# Patient Record
Sex: Male | Born: 1935 | Race: White | Hispanic: No | Marital: Married | Smoking: Former smoker
Health system: Southern US, Community
[De-identification: ages and names within clinical notes are randomized; demographics above are authoritative.]

## PROBLEM LIST (undated history)

## (undated) DIAGNOSIS — I499 Cardiac arrhythmia, unspecified: Secondary | ICD-10-CM

## (undated) DIAGNOSIS — R55 Syncope and collapse: Secondary | ICD-10-CM

## (undated) DIAGNOSIS — S8290XA Unspecified fracture of unspecified lower leg, initial encounter for closed fracture: Secondary | ICD-10-CM

## (undated) HISTORY — PX: OTHER SURGICAL HISTORY: SHX169

## (undated) HISTORY — DX: Unspecified fracture of unspecified lower leg, initial encounter for closed fracture: S82.90XA

## (undated) HISTORY — PX: MUSCLE REPAIR: SHX867

---

## 2002-07-13 ENCOUNTER — Inpatient Hospital Stay (HOSPITAL_COMMUNITY): Admission: RE | Admit: 2002-07-13 | Discharge: 2002-07-14 | Payer: Self-pay | Admitting: Neurosurgery

## 2002-07-13 ENCOUNTER — Encounter: Payer: Self-pay | Admitting: Neurosurgery

## 2004-01-26 ENCOUNTER — Other Ambulatory Visit: Payer: Self-pay

## 2004-01-28 ENCOUNTER — Other Ambulatory Visit: Payer: Self-pay

## 2007-03-16 ENCOUNTER — Ambulatory Visit: Payer: Self-pay

## 2007-03-18 ENCOUNTER — Ambulatory Visit: Payer: Self-pay

## 2007-04-05 ENCOUNTER — Ambulatory Visit: Payer: Self-pay | Admitting: Unknown Physician Specialty

## 2007-04-21 ENCOUNTER — Ambulatory Visit: Payer: Self-pay | Admitting: Specialist

## 2009-09-24 ENCOUNTER — Ambulatory Visit: Payer: Self-pay | Admitting: Ophthalmology

## 2009-10-07 ENCOUNTER — Ambulatory Visit: Payer: Self-pay | Admitting: Ophthalmology

## 2010-04-09 ENCOUNTER — Ambulatory Visit: Payer: Self-pay | Admitting: Endocrinology

## 2012-05-18 ENCOUNTER — Ambulatory Visit: Payer: Self-pay | Admitting: Cardiology

## 2012-05-24 ENCOUNTER — Ambulatory Visit: Payer: Self-pay | Admitting: General Surgery

## 2012-05-24 HISTORY — PX: HERNIA REPAIR: SHX51

## 2013-11-07 ENCOUNTER — Ambulatory Visit: Payer: Self-pay | Admitting: Ophthalmology

## 2013-11-07 DIAGNOSIS — I10 Essential (primary) hypertension: Secondary | ICD-10-CM

## 2013-11-13 ENCOUNTER — Ambulatory Visit: Payer: Self-pay | Admitting: Ophthalmology

## 2014-08-13 ENCOUNTER — Institutional Professional Consult (permissible substitution): Payer: Medicare Other | Admitting: Pulmonary Disease

## 2014-08-28 ENCOUNTER — Ambulatory Visit (INDEPENDENT_AMBULATORY_CARE_PROVIDER_SITE_OTHER): Payer: Medicare Other | Admitting: Pulmonary Disease

## 2014-08-28 ENCOUNTER — Encounter: Payer: Self-pay | Admitting: Pulmonary Disease

## 2014-08-28 VITALS — BP 124/70 | HR 80 | Ht 67.0 in | Wt 174.0 lb

## 2014-08-28 DIAGNOSIS — R1314 Dysphagia, pharyngoesophageal phase: Secondary | ICD-10-CM

## 2014-08-28 DIAGNOSIS — R059 Cough, unspecified: Secondary | ICD-10-CM

## 2014-08-28 DIAGNOSIS — J300 Vasomotor rhinitis: Secondary | ICD-10-CM

## 2014-08-28 DIAGNOSIS — R05 Cough: Secondary | ICD-10-CM

## 2014-08-28 MED ORDER — IPRATROPIUM BROMIDE 0.03 % NA SOLN: 2.0000 | Freq: Four times a day (QID) | NASAL | Status: DC

## 2014-08-28 NOTE — Progress Notes (Signed)
Subjective:    Patient ID: Alexander Webster, male    DOB: 07/13/1928, 80 y.o.   MRN: [id number]  HPI  Alexander Webster is here to see me for cough with mucus production.  He has experienced sinus drainage over the years and undergone an ENT evaluation which was normal.  He has seen Dr. Mayo AoFlemming in the past and has had PFTs.  He is unaware of any of diagnosis made there.  He previously smoked 1 pack a day for 25 or 30 years.    He notes that he will produce mucus throughout the day.  He notes that nearly continuously throughout the day he will produce signficant amounts.  This has been going on for 4-5 years.  It has ben about the same the whole time.  He has to cough up mucus at night frequently.  There are no clear triggers in his environment that make him cough more.  He says that it is worse early in the morning.  He walks regularly in the mornings and this makes him cough somewhat.  It is usually clear phlegm with out any color to it.  He says that he can feel mucus running down the back of his throat.    He notes trouble swallowing pills sometime.  Vitamins often get stuck and he has to drink water to make it pass.  He does not have problems swallowing food and drink.   He really doesn't get much shortness of breath very often.  He mentioned some mild dyspnea when climbing a hill to his PCP so he underwent a CXR, stress test and EKG which was normal. This was in 03/2014.  He does not think that this mild dyspnea has worsened. He regularly hikes 5-6 miles at a time with his brother.    No past medical history on file.   Family History  Problem Relation Age of Onset  . Heart disease Mother   . Cancer Mother     lymphoma  . Cancer Father     prostate     History   Social History  . Marital Status: Married    Spouse Name: N/A    Number of Children: N/A  . Years of Education: N/A   Occupational History  . Not on file.   Social History Main Topics  . Smoking status: Former Smoker -- 1.00  packs/day for 20 years    Types: Cigarettes, Pipe    Quit date: 08/28/1994  . Smokeless tobacco: Never Used     Comment: Smoked cigarettes, pipe.   . Alcohol Use: No  . Drug Use: Not on file  . Sexual Activity: Not on file   Other Topics Concern  . Not on file   Social History Narrative  . No narrative on file     Allergies  Allergen Reactions  . Percocet [Oxycodone-Acetaminophen]     itching     No outpatient prescriptions prior to visit.   No facility-administered medications prior to visit.      Review of Systems  Constitutional: Negative for fever and unexpected weight change.  HENT: Positive for congestion. Negative for dental problem, ear pain, nosebleeds, postnasal drip, rhinorrhea, sinus pressure, sneezing, sore throat and trouble swallowing.   Eyes: Negative for redness and itching.  Respiratory: Negative for cough, chest tightness, shortness of breath and wheezing.   Cardiovascular: Negative for palpitations and leg swelling.  Gastrointestinal: Negative for nausea and vomiting.  Genitourinary: Negative for dysuria.  Musculoskeletal: Negative for joint  swelling.  Skin: Negative for rash.  Neurological: Negative for headaches.  Hematological: Does not bruise/bleed easily.  Psychiatric/Behavioral: Negative for dysphoric mood. The patient is not nervous/anxious.        Objective:   Physical Exam Filed Vitals:   08/28/14 1548  BP: 124/70  Pulse: 80  Height: 5\' 7"  (1.702 m)  Weight: 174 lb (78.926 kg)  SpO2: 94%   RA  Gen: well appearing, no acute distress HEENT: NCAT, PERRL, EOMi, OP clear, neck supple without masses PULM: CTA B CV: RRR, no mgr, no JVD AB: BS+, soft, nontender, no hsm Ext: warm, no edema, no clubbing, no cyanosis Derm: no rash or skin breakdown Neuro: A&Ox4, CN II-XII intact, strength 5/5 in all 4 extremities      Assessment & Plan:   Cough He describes daily sputum production. However, in clinic he only coughed up some  saliva versus very thin, watery mucus. On pulmonary exam I do not hear clear evidence of lung disease and his oximetry is normal. Unfortunately, I am unable to see any of his recent chest imaging or old records of pulmonary function testing. At this point I do not see clear evidence of lung disease.  My working diagnosis at this point is cough due to vasomotor rhinitis.  Plan: -Obtain records of previous chest imaging, most recent was this year -Obtain records of previous pulmonary evaluation and pulmonary function testing -Obtain records from ear nose and throat physician -See below  Vasomotor rhinitis Vasomotor rhinitis (chronic non-allergic rhinitis): When inhaled corticosteroids or antihistamines are not helpful, ipratropium nasal spray (0.03%, 2 puffs each nare tid) is often effective as demonstrated in two trials of 285 and 253 patients (J Allergy Clin Immunol. 1610;96(01995;95(5 Pt 2):1123. nd J Allergy Clin Immunol. 4540;98(11995;95(5 Pt 2):1117).  -start ipratropium nasal spray (0.03%) 2 puffs bid-tid prn     Updated Medication List No outpatient encounter prescriptions on file as of 08/28/2014.

## 2014-08-28 NOTE — Patient Instructions (Signed)
We will arrange a barium swallow test for you Take the ipratroium nasal spray 4 times a day regularly for a week to see if that helps We will see you back in 2-4 weeks

## 2014-08-28 NOTE — Assessment & Plan Note (Signed)
Vasomotor rhinitis (chronic non-allergic rhinitis): When inhaled corticosteroids or antihistamines are not helpful, ipratropium nasal spray (0.03%, 2 puffs each nare tid) is often effective as demonstrated in two trials of 285 and 253 patients (J Allergy Clin Immunol. 1995;95(5 Pt 2):1123. nd J Allergy Clin Immunol. 1995;95(5 Pt 2):1117).  -start ipratropium nasal spray (0.03%) 2 puffs bid-tid prn  

## 2014-08-28 NOTE — Assessment & Plan Note (Signed)
He describes daily sputum production. However, in clinic he only coughed up some saliva versus very thin, watery mucus. On pulmonary exam I do not hear clear evidence of lung disease and his oximetry is normal. Unfortunately, I am unable to see any of his recent chest imaging or old records of pulmonary function testing. At this point I do not see clear evidence of lung disease.  My working diagnosis at this point is cough due to vasomotor rhinitis.  Plan: -Obtain records of previous chest imaging, most recent was this year -Obtain records of previous pulmonary evaluation and pulmonary function testing -Obtain records from ear nose and throat physician -See below

## 2014-09-04 ENCOUNTER — Ambulatory Visit: Payer: Self-pay | Admitting: Pulmonary Disease

## 2014-09-07 ENCOUNTER — Encounter: Payer: Self-pay | Admitting: Pulmonary Disease

## 2014-09-10 ENCOUNTER — Telehealth: Payer: Self-pay

## 2014-09-10 DIAGNOSIS — R059 Cough, unspecified: Secondary | ICD-10-CM

## 2014-09-10 DIAGNOSIS — R05 Cough: Secondary | ICD-10-CM

## 2014-09-10 NOTE — Telephone Encounter (Signed)
Pt aware of results and recs. Referral made.  Nothing further needed.

## 2014-09-10 NOTE — Telephone Encounter (Signed)
Message copied by Velvet BatheAULFIELD, Alaysia Lightle L on Mon Sep 10, 2014  5:58 PM ------      Message from: Max FickleMCQUAID, DOUGLAS B      Created: Fri Sep 07, 2014  5:10 PM       A,      Please let him know that his barium swallow showed a narrowing in his distal esophagus.  The radiologist didn't think that this was worrisome.            I'd like to refer him to Dr. Kandis FantasiaMatt Rein in [place] for evaluation of this.            Thanks      B ------

## 2014-09-21 ENCOUNTER — Encounter: Payer: Self-pay | Admitting: Pulmonary Disease

## 2014-12-20 ENCOUNTER — Ambulatory Visit: Payer: Self-pay | Admitting: Unknown Physician Specialty

## 2015-02-08 ENCOUNTER — Inpatient Hospital Stay: Payer: Self-pay | Admitting: General Practice

## 2015-02-08 HISTORY — PX: LEG SURGERY: SHX1003

## 2015-02-08 LAB — CBC
HCT: 45.2 % (ref 40.0–52.0)
HGB: 15.1 g/dL (ref 13.0–18.0)
MCH: 32.3 pg (ref 26.0–34.0)
MCHC: 33.4 g/dL (ref 32.0–36.0)
MCV: 97 fL (ref 80–100)
Platelet: 189 10*3/uL (ref 150–440)
RBC: 4.68 10*6/uL (ref 4.40–5.90)
RDW: 13.7 % (ref 11.5–14.5)
WBC: 6.9 10*3/uL (ref 3.8–10.6)

## 2015-02-08 LAB — COMPREHENSIVE METABOLIC PANEL
Albumin: 4.2 g/dL
Alkaline Phosphatase: 58 U/L
Anion Gap: 7 (ref 7–16)
BUN: 26 mg/dL — ABNORMAL HIGH
Bilirubin,Total: 0.8 mg/dL
CALCIUM: 9.4 mg/dL
Chloride: 109 mmol/L
Co2: 25 mmol/L
Creatinine: 1.2 mg/dL
EGFR (Non-African Amer.): 55 — ABNORMAL LOW
Glucose: 106 mg/dL — ABNORMAL HIGH
Potassium: 4.3 mmol/L
SGOT(AST): 28 U/L
SGPT (ALT): 21 U/L
Sodium: 141 mmol/L
Total Protein: 7 g/dL

## 2015-02-08 LAB — CK: CK, Total: 349 U/L

## 2015-02-09 LAB — URINALYSIS, COMPLETE
Bacteria: NONE SEEN
Bilirubin,UR: NEGATIVE
Blood: NEGATIVE
Glucose,UR: NEGATIVE mg/dL (ref 0–75)
LEUKOCYTE ESTERASE: NEGATIVE
Nitrite: NEGATIVE
Ph: 5 (ref 4.5–8.0)
Protein: NEGATIVE
RBC,UR: 1 /HPF (ref 0–5)
Specific Gravity: 1.028 (ref 1.003–1.030)
Squamous Epithelial: NONE SEEN
WBC UR: 1 /HPF (ref 0–5)

## 2015-02-11 ENCOUNTER — Encounter
Admit: 2015-02-11 | Disposition: A | Discharge status: Home / Self Care | Payer: Self-pay | Attending: Internal Medicine | Admitting: Internal Medicine

## 2015-02-15 ENCOUNTER — Encounter
Admit: 2015-02-15 | Disposition: A | Discharge status: Home / Self Care | Payer: Self-pay | Attending: Internal Medicine | Admitting: Internal Medicine

## 2015-03-09 NOTE — Op Note (Signed)
PATIENT NAME:  Alexander Webster, Alexander Webster MR#:  [id number] DATE OF BIRTH:  13-Nov-1928  DATE OF PROCEDURE:  11/13/2013  PREOPERATIVE DIAGNOSIS: Cataract, left eye.   POSTOPERATIVE DIAGNOSIS: Cataract, left eye.   PROCEDURE PERFORMED: Extracapsular cataract extraction using phacoemulsification with placement Alcon [id number], 23.0-diopter posterior chamber lens, serial number [id number].   SURGEON: Alexander Webster, M.D.   ANESTHESIA: 4% lidocaine and 0.75% Marcaine,  a 50:50 mixture with 10 units/mL of HyoMax added, given as a peribulbar.   ANESTHESIOLOGIST: Dr. Darleene CleaverVan Staveren.   COMPLICATIONS: None.   ESTIMATED BLOOD LOSS: Less than 1 mL.   DESCRIPTION OF PROCEDURE:  The patient was brought to the operating room and given a peribulbar block.  The patient was then prepped and draped in the usual fashion.  The vertical rectus muscles were imbricated using 5-0 silk sutures.  These sutures were then clamped to the sterile drapes as bridle sutures.  A limbal peritomy was performed extending two clock hours and hemostasis was obtained with cautery.  A partial thickness scleral groove was made at the surgical limbus and dissected anteriorly in a lamellar dissection using an Alcon crescent knife.  The anterior chamber was entered supero-temporally with a Superblade and through the lamellar dissection with a 2.6 mm keratome.  DisCoVisc was used to replace the aqueous and a continuous tear capsulorrhexis was carried out.  Hydrodissection and hydrodelineation were carried out with balanced salt and a 27 gauge canula.  The nucleus was rotated to confirm the effectiveness of the hydrodissection.  Phacoemulsification was carried out using a divide-and-conquer technique.  Total ultrasound time was 1 minute and 17.3 seconds with an average power of 23.6 percent. CDE of 32.20.  No sutures placed.    Irrigation/aspiration was used to remove the residual cortex.  DisCoVisc was used to inflate the capsule and the internal  incision was enlarged to 3 mm with the crescent knife.  The intraocular lens was folded and inserted into the capsular bag using an AcrySert delivery system was used.  Irrigation/aspiration was used to remove the residual DisCoVisc.  Miostat was injected into the anterior chamber through the paracentesis track to inflate the anterior chamber and induce miosis.  A tenth of a milliliter of cefuroxime containing 1 mg of drug was injected via the paracentesis tract.  The wound was checked for leaks and none were found. The conjunctiva was closed with cautery and the bridle sutures were removed.  Two drops of 0.3% Vigamox were placed on the eye.  An eye shield was placed on the eye.  The patient was discharged to the recovery room in good condition.   ____________________________ Alexander PeppersSteven A. Treniya Lobb, MD sad:np D: 11/13/2013 13:37:39 ET T: 11/13/2013 15:08:36 ET JOB#: [id number]  cc: Viviann SpareSteven A. Rolando Whitby, MD, <Dictator> Erline LevineSTEVEN A Aleeyah Bensen MD ELECTRONICALLY SIGNED 11/20/2013 11:55

## 2015-03-10 NOTE — Op Note (Signed)
PATIENT NAME:  [name], [name] MR#:  [id number] DATE OF BIRTH:  [birth year]  DATE OF PROCEDURE:  05/24/2012  PREOPERATIVE DIAGNOSIS: Bilateral inguinal hernia.   POSTOPERATIVE DIAGNOSES:  1. Left inguinal hernia, indirect of the sliding variety along with a small direct hernia. 2. Right direct inguinal hernia.   OPERATION: Bilateral inguinal hernia repairs.   SURGEON: Kathreen CosierS. G. Kayven Aldaco, M.D.   ANESTHESIA: General.   COMPLICATIONS: None.  ESTIMATED BLOOD LOSS: 25 mL.  DRAINS: None.   DESCRIPTION OF PROCEDURE: The patient was put to sleep with a LMA and the groin areas were prepped and draped out as a sterile field. Skin incisions were mapped on both sides overlying the medial two-thirds of the inguinal canal area. 15 mL of 0.5% Marcaine was used to reinforce pain control in both groins. A total of 30 mL was used. The left side was operated on first since this was his symptomatic side. A skin incision was made and deepened through the layers down to the external oblique. Bleeding controlled with cautery and ligatures of 3-0 Vicryl. The external oblique was opened along the line of its fibers and local anesthetic was instilled into the area of the pubic tubercle and also near the internal ring area. The cord was dissected free from the posterior wall and noted to contain a small direct hernia in the medial aspect. The fascia covering the cord was opened to reveal the presence of a lipoma of the cord attached to which was a peritoneal sac, but it turns out that this was a sliding hernia with the medial wall of the sac being that of the sigmoid colon. After ensuring that there were no other contents within the hernia, the peritoneal opening was closed and the entire hernial protrusion was pushed back in through the internal ring which was fairly wide. This was then narrowed down by placement of 2-0 PDS stitch laterally to close the internal ring. Following this, the fascia overlying the cord was closed with 2-0  Vicryl. The direct hernia was pushed back and held in place with plicating sutures of 2-0 Vicryl. The posterior wall was satisfactorily exposed. A Parietex ProGrip mesh was then placed around the cord and laid down. Excess of this along the inferior edge was trimmed. The medial end was tacked to the pubic tubercle with a single 2-0 PDS stitch. The lateral ends were then tucked underneath the external oblique and satisfactory reinforcement of the posterior wall was obtained. After ensuring hemostasis, the external oblique was closed over with a running 2-0 PDS stitch. The subcutaneous tissue was closed with 3-0 Vicryl and then skin closed with subcuticular 4-0 Vicryl. A similar procedure was performed on the right side now after instillation of local anesthetic. A skin incision was made and deepened through the external oblique which was then opened. The cord here was much thinner when compared to the one on the left side and exploration here did not reveal the presence of a hernia, but a small lipoma of the cord was encountered. This was excised out and ligated with 2-0 Vicryl. The posterior wall was exposed. A direct hernia was noted in the medial aspect which was freed and pushed back through what appeared to be a fingerbreadth size opening in the posterior wall of the inguinal canal. This was then closed with two stitches of 2-0 PDS. Reinforcement was again obtained with a ProGrip mesh placed over the posterior wall and around the internal ring and snug down to the posterior wall. Excess  trimmed along the inferior edge and the lateral aspect which was tucked underneath the external oblique. Again, the 2-0 PDS stitch was used to tack it to the pubic tubercle. After ensuring hemostasis, closure was obtained similar to that on the left side. Steri-Strips dressings were placed. The patient subsequently was extubated and returned to the recovery room in stable condition.   ____________________________ S.Wynona Luna,  MD sgs:ap D: 05/24/2012 13:14:29 ET T: 05/24/2012 13:30:15 ET JOB#: [id number]  cc: S.G. Evette Cristal, MD, <Dictator>  [facility] Wynona Luna MD ELECTRONICALLY SIGNED 05/25/2012 6:23

## 2015-03-11 LAB — SURGICAL PATHOLOGY

## 2015-03-17 NOTE — Op Note (Signed)
PATIENT NAME:  [name], [name] MR#:  [id number] DATE OF BIRTH:  January 22, 1928  DATE OF PROCEDURE:  02/08/2015  PREOPERATIVE DIAGNOSIS: Open right bimalleolar ankle fracture.   POSTOPERATIVE DIAGNOSIS: Open right bimalleolar ankle fracture.  PROCEDURE PERFORMED: Open reduction and internal fixation of right bimalleolar ankle fracture.   SURGEON: Illene LabradorJames P. Angie FavaHooten Jr., MD  ANESTHESIA: General.   ESTIMATED BLOOD LOSS: 25 mL.   FLUIDS REPLACED: Crystalloid 700 mL.   TOURNIQUET TIME: 90 minutes.   DRAINS: None.   IMPLANTS UTILIZED: Synthes 7 hole one-third tubular plate, five 3.5 mm cortical screws, one 4.0 mm fully threaded cancellous screw, and two 4.0 mm cannulated partially threaded cancellus screws.   INDICATIONS FOR SURGERY: The patient is an 81 year old male who was cutting down a tree when the tree kicked back and struck him along the left side causing him to fall to the right. He sustained a right bimalleolar ankle fracture with a horizontal laceration to the lateral aspect of the ankle. After discussion of the risks and benefits of surgical intervention, the patient expressed understanding of the risks, benefits, and agreed with plans for surgical intervention.   PROCEDURE IN DETAIL: The patient was brought to the operating room and, after adequate general anesthesia was achieved, a tourniquet was placed on the patient's right thigh. The patient's right ankle and leg were cleaned and prepped with Betadine and draped in the usual sterile fashion. A "timeout" was performed as per usual protocol. The right lower extremity was exsanguinated using an Esmarch, and the tourniquet was inflated to 300 mmHg. The lateral horizontal laceration was irrigated with copious amounts of normal saline with antibiotic solution. Skin edges were sharply debrided. The fracture site was identified using FluoroScan. A lateral longitudinal incision was made roughly creating a T-type intersection with the laceration.  Dissection was carried down to the fracture site. The site was cleared of hematoma and soft tissue and provisional reduction was performed. A 7 hole one-third tubular plate was contoured so as to be placed along the lateral aspect of the distal fibula. The plate was then secured using a total of five 3.5 mm cortical screws and one 4.0 mm fully threaded cancellous screw. Good position of the hardware was noted with anatomic reduction appreciated, as visualized on FluoroScan. The wound was again irrigated with copious amounts of normal saline with antibiotic solution. The skin edges were approximated in layers using first #0 Monocryl followed by 2-0 Monocryl. Next, attention was directed to the medial malleolus. The tip of the medial malleolus was identified and a transverse incision was made distal to the tip of the medial malleolus. Two 1.25 mm distally threaded guidewires were inserted in a parallel fashion into the medial malleolus and into the metaphyseal region. This allowed for excellent reduction restoration of the ankle mortise. Two 4.0 mm partially threaded a cannulated cancellous screws then advanced over the guidewires with good compression noted at the fracture site. Guidewires were removed. The wound was irrigated with copious amounts of normal saline with antibiotic solution. The skin edges of both the medial and lateral incisions were then reapproximated using skin staples. A sterile dressing was applied after deflating tourniquet at 90 minutes. A posterior splint was applied.   The patient tolerated the procedure well. He was transported to the recovery room in stable condition.    ____________________________ Illene LabradorJames P. Angie FavaHooten Jr., MD jph:bm D: 02/08/2015 21:20:39 ET T: 02/09/2015 05:06:54 ET JOB#: [id number]  cc: Illene LabradorJames P. Angie FavaHooten Jr., MD, <Dictator> JAMES P Angie FavaHOOTEN JR  MD ELECTRONICALLY SIGNED 02/23/2015 10:01

## 2015-03-17 NOTE — Discharge Summary (Signed)
PATIENT NAME:  Alexander Webster, Alexander Webster MR#:  [id number] DATE OF BIRTH:  1928/05/15  DATE OF ADMISSION:  02/08/2015 DATE OF DISCHARGE:  02/10/2015  ADMITTING DIAGNOSIS: Open right bimalleolar ankle fracture.   DISCHARGE DIAGNOSIS: Open right bimalleolar ankle fracture.   PROCEDURE PERFORMED: Open reduction internal fixation of right bimalleolar ankle fracture.   SURGEON: Francesco SorJames Hooten, M.D.   ANESTHESIA: General.   ESTIMATED BLOOD LOSS: 25 mL.  CRYSTALLOIDS:  700 mL.  TOURNIQUET TIME: 90 minutes.   DRAINS: None.   IMPLANTS UTILIZED:  Synthes 7-hole 1/3 tubular plate, five 3.5 mm cortical screws, one 4.0 mm fully threaded cancellous screw and two 4.0 mm cannulated partially threaded cancellous screws.   INDICATIONS FOR SURGERY:  The patient is an 81 year old male who was cutting down a tree.  The tree kicked back and struck him along the left side causing him to fall to the right. He sustained a right bimalleolar ankle fracture with a horizontal laceration to the lateral aspect of the ankle. After discussion of the risks and benefits of surgical intervention, the patient expressed understanding of the risks and benefits of, and agreed with plans, for surgical intervention.   PHYSICAL EXAMINATION:  HEENT: Pupils equal, round, and reactive to light. Hearing intact to voice. Moist oral mucosa.  OROPHARYNX: Clear. NECK: Supple.  RESPIRATIONS: Clear breath sounds, no use of accessory muscle usage.  CARDIOVASCULAR: Regular rate and rhythm. No murmur. No lower extremity edema.  ABDOMEN: Denies tenderness, soft, normal bowel sounds.  EXTREMITIES: Tender to palpation on the medial and lateral malleoli, transverse laceration to the lateral aspect of the ankle, moderate swelling of the medial ankle. Abrasions to the left lateral thigh and left forearm.  NEUROLOGIC:  Alert and oriented to person, place, and time.   HOSPITAL COURSE: The patient was admitted to the hospital on 02/08/2015. He had surgery  that same day and was brought to the orthopedic floor from the PACU in stable condition. On postoperative day 1, the patient had excellent progress with physical therapy. Pain was well controlled. Vital signs were stable. On postoperative day 2, the patient was stable and ready for discharge to home with home health physical therapy.  The patient had made significant progress in physical therapy over the first 2 days. He was stable and ready for discharge to home with home health physical therapy.   CONDITION AT DISCHARGE: Stable.   DISCHARGE INSTRUCTIONS: Do not put weight on the affected extremity. Elevate the affected extremity, heels off the bed. Use incentive spirometry every hour while awake, encourage cough and deep breathing. The patient may resume a regular diet as tolerated. Apply Polar Care unit maintaining 10 to 40-50 degrees.  Do not get the dressing or bandage wet or dirty.  Call [facility] if the dressing gets water under it.  Leave the dressing on. Call [facility] if any of the following occur: Bright red bleeding from the incisional wound, fever above 101.5 degrees, redness, swelling, or drainage at the incision site. Call [facility] if you experience any increased leg pain, numbness or weakness in your legs, or bowel or bladder symptoms. Call [facility] for a followup appointment on 3/28 or 3/292016.   DISCHARGE MEDICATIONS: Please see discharge medications on discharge instructions.    ____________________________ T. Cranston Neighborhris Tiffney Haughton, PA-C tcg:sp D: 02/09/2015 22:57:00 ET T: 02/10/2015 09:37:51 ET JOB#: [id number]  cc: T. Cranston Neighborhris Tysean Vand[place]li[place], PA-C, <Dictator> Evon SlackHOMAS C Falisa Lamora GeorgiaPA ELECTRONICALLY SIGNED 02/18/2015 15:41

## 2015-03-17 NOTE — H&P (Signed)
Subjective/Chief Complaint right ankle pain   History of Present Illness 81 year old male was cutting down a tree when it bounced back and struck him on the left leg, causing him to fall to the right. He had severe right ankle pain and was unable to bear weight on the right lower extremity. He was noted to have a laceration to the lateral right ankle. No loss of consciousness.   Past Med/Surgical Hx:  Osteoarthritis:   Hyperlipidemia:   Hypertension:   Right and Left Bundle Branch Block:   Hepatitis A:   Syncope:   Silent Migraines:   Knee Arthroscopy:   Left hand 1st digit reattachment:   Herniated Disc:   Cataract Extraction: right  Appendectomy:   Prostate Surgery:   Right leg ORIF:   ALLERGIES:  Vicodin: Other  Bee Stings: Rash, Resp. Distress   Medications None   Family and Social History:  Family History brain tumor - mother  prostate cancer - father   Social History negative Illicit drugs, wife recently died   + Tobacco Prior (greater than 1 year)   Place of Living Home   Review of Systems:  Fever/Chills No   Cough No   Sputum No   Abdominal Pain No   Diarrhea No   Constipation No   Nausea/Vomiting No   SOB/DOE No   Chest Pain No   Physical Exam:  GEN well developed, well nourished, no acute distress   HEENT PERRL, hearing intact to voice, moist oral mucosa, Oropharynx clear   NECK supple   RESP clear BS  no use of accessory muscles   CARD regular rate  no murmur  No LE edema   ABD denies tenderness  soft  normal BS   EXTR Tender to medial & lateral right malleoli, transverse laceration to lateral aspect of ankle. Moderate swelling to medial ankle.  Abrasions to lateral left thigh and left forearm   NEURO motor/sensory function intact   PSYCH alert, A+O to time, place, person   Lab Results: Hepatic:  25-Mar-16 13:57   Bilirubin, Total 0.8 (0.3-1.2 NOTE: New Reference Range  01/22/15)  Alkaline Phosphatase 58 (38-126 NOTE:  New Reference Range  01/22/15)  SGPT (ALT) 21 (17-63 NOTE: New Reference Range  01/22/15)  SGOT (AST) 28 (15-41 NOTE: New Reference Range  01/22/15)  Total Protein, Serum 7.0 (6.5-8.1 NOTE: New Reference Range  01/22/15)  Albumin, Serum 4.2 (3.5-5.0 NOTE: New reference range  01/22/15)  Routine Chem:  25-Mar-16 13:57   Glucose, Serum  106 (65-99 NOTE: New Reference Range  01/22/15)  BUN  26 (6-20 NOTE: New Reference Range  01/22/15)  Sodium, Serum 141 (135-145 NOTE: New Reference Range  01/22/15)  Potassium, Serum 4.3 (3.5-5.1 NOTE: New Reference Range  01/22/15)  Chloride, Serum 109 (101-111 NOTE: New Reference Range  01/22/15)  CO2, Serum 25 (22-32 NOTE: New Reference Range  01/22/15)  Calcium (Total), Serum 9.4 (8.9-10.3 NOTE: New Reference Range  01/22/15)  eGFR (African American) >60  eGFR (Non-African American)  55 (eGFR values <43m/min/1.73 m2 may be an indication of chronic kidney disease (CKD). Calculated eGFR is useful in patients with stable renal function. The eGFR calculation will not be reliable in acutely ill patients when serum creatinine is changing rapidly. It is not useful in patients on dialysis. The eGFR calculation may not be applicable to patients at the low and high extremes of body sizes, pregnant women, and vegetarians.)  Anion Gap 7  Cardiac:  25-Mar-16 13:57  CK, Total 349 [phone] NOTE: New Reference Range  01/22/15)  Routine Hem:  25-Mar-16 13:57   WBC (CBC) 6.9  RBC (CBC) 4.68  Hemoglobin (CBC) 15.1  Hematocrit (CBC) 45.2  Platelet Count (CBC) 189 (Result(s) reported on 08 Feb 2015 at 02:25PM.)  MCV 97  MCH 32.3  MCHC 33.4  RDW 13.7    Assessment/Admission Diagnosis Right bimalleolar ankle fracture with lateral laceration   Plan Recommend ORIF and debridement  The risks and benefits of surgical intervention were discussed in detail with the patient. The patient expressed understanding of the risks and benefits and  agreed with plans for surgery.  Surgical site signed as per "right site surgery" protocol.   Electronic Signatures: Dereck Leep (MD)  (Signed 25-Mar-16 17:15)  Authored: CHIEF COMPLAINT and HISTORY, PAST MEDICAL/SURGIAL HISTORY, ALLERGIES, OTHER MEDICATIONS, FAMILY AND SOCIAL HISTORY, REVIEW OF SYSTEMS, PHYSICAL EXAM, LABS, ASSESSMENT AND PLAN   Last Updated: 25-Mar-16 17:15 by Dereck Leep (MD)

## 2015-06-10 ENCOUNTER — Encounter: Payer: Self-pay | Admitting: *Deleted

## 2015-06-18 ENCOUNTER — Ambulatory Visit (INDEPENDENT_AMBULATORY_CARE_PROVIDER_SITE_OTHER): Payer: Medicare Other | Admitting: General Surgery

## 2015-06-18 ENCOUNTER — Encounter: Payer: Self-pay | Admitting: General Surgery

## 2015-06-18 VITALS — BP 140/80 | HR 80 | Resp 14 | Ht 66.0 in | Wt 160.0 lb

## 2015-06-18 DIAGNOSIS — R1030 Lower abdominal pain, unspecified: Secondary | ICD-10-CM | POA: Diagnosis not present

## 2015-06-18 DIAGNOSIS — R1031 Right lower quadrant pain: Secondary | ICD-10-CM

## 2015-06-18 NOTE — Patient Instructions (Addendum)
The patient is aware to call back for any questions or concerns.  Patient is scheduled for a CT of the abdomen/pelvis with contrast at [facility] on 07/01/15 at 8:00 am. He will arrive at registration at 7:45 am. The patient will pick up a prep kit today. He is to have liquids only 4 hours prior to the exam. Patient is aware of date, time, and instructions.

## 2015-06-18 NOTE — Progress Notes (Signed)
Patient ID: Alexander Webster, male   DOB: [birth year], 81 y.o.   MRN: [id number]  Chief Complaint  Patient presents with  . Hernia    HPI Alexander Webster is a 81 y.o. male.  Here today for evaluation of possible right inguinal hernia. He states he has had some pain in the right groin area on and off for 2-3 months as well as testicular pain. He said that Dr Ronnald Collum told him it was a hernia.  HPI  Past Medical History  Diagnosis Date  . Leg fracture     Past Surgical History  Procedure Laterality Date  . Muscle repair Right     calf  . Ruptured disc      ruptured disc repair  . Hernia repair Bilateral 05-24-12    Dr Jamal Collin  . Leg surgery Right 02/08/2015    Family History  Problem Relation Age of Onset  . Heart disease Mother   . Cancer Mother     lymphoma  . Cancer Father     prostate    Social History History  Substance Use Topics  . Smoking status: Former Smoker -- 1.00 packs/day for 20 years    Types: Cigarettes, Pipe    Quit date: 08/28/1994  . Smokeless tobacco: Never Used     Comment: Smoked cigarettes, pipe.   . Alcohol Use: No    Allergies  Allergen Reactions  . Percocet [Oxycodone-Acetaminophen]     Itching/pt states he can take this now  . Vicodin [Hydrocodone-Acetaminophen] Itching    Current Outpatient Prescriptions  Medication Sig Dispense Refill  . aspirin 325 MG EC tablet Take 325 mg by mouth daily.    . Calcium Carbonate-Vit D-Min (CALCIUM 1200 PO) Take by mouth daily.    . Cholecalciferol (VITAMIN D3) 2000 UNITS TABS Take by mouth daily.    . Cyanocobalamin (VITAMIN B-12 CR) 1000 MCG TBCR Take by mouth daily.    . Magnesium 250 MG TABS Take by mouth daily.    . NON FORMULARY Black stallion    . NON FORMULARY gsh-3 cell defense    . NON FORMULARY True curcumin    . NON FORMULARY resetigen d    . Nutritional Supplements (OSTEO ADVANCE PO) Take by mouth daily.    . Omega-3 Fatty Acids (FISH OIL) 1200 MG CAPS Take by mouth daily.     No current  facility-administered medications for this visit.    Review of Systems Review of Systems  Constitutional: Negative.   Respiratory: Positive for cough.   Cardiovascular: Negative.     Blood pressure 140/80, pulse 80, resp. rate 14, height _0  (1.676 m), weight 160 lb (72.576 kg).  Physical Exam Physical Exam  Constitutional: He is oriented to person, place, and time. He appears well-developed and well-nourished.  HENT:  Mouth/Throat: Oropharynx is clear and moist.  Eyes: Conjunctivae are normal. No scleral icterus.  Neck: Neck supple.  Cardiovascular: Normal rate, regular rhythm and normal heart sounds.   Pulmonary/Chest: Effort normal and breath sounds normal.  Abdominal: Soft. Normal appearance and bowel sounds are normal. There is no hepatomegaly. There is no tenderness. No hernia.  Lymphadenopathy:    He has no cervical adenopathy.  Neurological: He is alert and oriented to person, place, and time.  Skin: Skin is warm and dry.  Psychiatric: His behavior is normal.  Careful exam of right groin shows no apparent hernia.   Data Reviewed Office notes reviewed.   Assessment    Right groin and right  lower abdominal pain. No apparent findings on exam.      Plan    Recommended CT scan of abdomen/pelvis. Follow up based on the findings. Pt is agreeable.    Patient is scheduled for a CT of the abdomen/pelvis with contrast at [facility] on 07/01/15 at 8:00 am. He will arrive at registration at 7:45 am. The patient will pick up a prep kit today. He is to have liquids only 4 hours prior to the exam. Patient is aware of date, time, and instructions.  PCP:  Daphene Jaeger G 06/18/2015, 8:23 PM

## 2015-07-01 ENCOUNTER — Ambulatory Visit
Admission: RE | Admit: 2015-07-01 | Discharge: 2015-07-01 | Disposition: A | Discharge status: Home / Self Care | Payer: Medicare Other | Source: Ambulatory Visit | Attending: General Surgery | Admitting: General Surgery

## 2015-07-01 DIAGNOSIS — N281 Cyst of kidney, acquired: Secondary | ICD-10-CM | POA: Diagnosis not present

## 2015-07-01 DIAGNOSIS — R1030 Lower abdominal pain, unspecified: Secondary | ICD-10-CM | POA: Diagnosis present

## 2015-07-01 DIAGNOSIS — M47816 Spondylosis without myelopathy or radiculopathy, lumbar region: Secondary | ICD-10-CM | POA: Insufficient documentation

## 2015-07-01 DIAGNOSIS — R1031 Right lower quadrant pain: Secondary | ICD-10-CM

## 2015-07-01 DIAGNOSIS — I2510 Atherosclerotic heart disease of native coronary artery without angina pectoris: Secondary | ICD-10-CM | POA: Insufficient documentation

## 2015-07-01 DIAGNOSIS — K4020 Bilateral inguinal hernia, without obstruction or gangrene, not specified as recurrent: Secondary | ICD-10-CM | POA: Insufficient documentation

## 2015-07-01 MED ORDER — IOHEXOL 300 MG/ML  SOLN
100.0000 mL | Freq: Once | INTRAMUSCULAR | Status: AC | PRN
  Administered 2015-07-01: 100 mL via INTRAVENOUS

## 2015-07-04 ENCOUNTER — Telehealth: Payer: Self-pay | Admitting: *Deleted

## 2015-07-04 NOTE — Telephone Encounter (Signed)
Patient has been scheduled for an appointment next Tuesday, 07-09-15.

## 2015-07-04 NOTE — Telephone Encounter (Signed)
-----   Message from Kieth Brightly, MD sent at 07/04/2015  7:14 AM EDT ----- Please make an appt for pt to discuss CT findings

## 2015-07-04 NOTE — Telephone Encounter (Signed)
Message left on home and cell numbers for patient to call the office.  

## 2015-07-09 ENCOUNTER — Encounter: Payer: Self-pay | Admitting: General Surgery

## 2015-07-09 ENCOUNTER — Ambulatory Visit (INDEPENDENT_AMBULATORY_CARE_PROVIDER_SITE_OTHER): Payer: Medicare Other | Admitting: General Surgery

## 2015-07-09 VITALS — BP 112/70 | HR 78 | Resp 18 | Ht 66.0 in | Wt 166.0 lb

## 2015-07-09 DIAGNOSIS — R1030 Lower abdominal pain, unspecified: Secondary | ICD-10-CM | POA: Diagnosis not present

## 2015-07-09 DIAGNOSIS — R1031 Right lower quadrant pain: Secondary | ICD-10-CM

## 2015-07-09 NOTE — Progress Notes (Signed)
Patient ID: Alexander Webster, male   DOB: 10-Dec-1927, 80 y.o.   MRN: [id number]  Chief Complaint  Patient presents with  . Follow-up    CT Scan on 07/01/2015    HPI Alexander Webster is a 81 y.o. male.  Here today following up from a CT scan one 07-01-15. Denies any pain in the groin area at this time. States he was able to work out in the garden yesterday with no pain.    HPI  Past Medical History  Diagnosis Date  . Leg fracture     Past Surgical History  Procedure Laterality Date  . Muscle repair Right     calf  . Ruptured disc      ruptured disc repair  . Hernia repair Bilateral 05-24-12    Dr Evette Cristal  . Leg surgery Right 02/08/2015    Family History  Problem Relation Age of Onset  . Heart disease Mother   . Cancer Mother     lymphoma  . Cancer Father     prostate    Social History Social History  Substance Use Topics  . Smoking status: Former Smoker -- 1.00 packs/day for 20 years    Types: Cigarettes, Pipe    Quit date: 08/28/1994  . Smokeless tobacco: Never Used     Comment: Smoked cigarettes, pipe.   . Alcohol Use: No    Allergies  Allergen Reactions  . Percocet [Oxycodone-Acetaminophen]     Itching/pt states he can take this now  . Vicodin [Hydrocodone-Acetaminophen] Itching    Current Outpatient Prescriptions  Medication Sig Dispense Refill  . aspirin 325 MG EC tablet Take 325 mg by mouth daily.    . Calcium Carbonate-Vit D-Min (CALCIUM 1200 PO) Take by mouth daily.    . Cholecalciferol (VITAMIN D3) 2000 UNITS TABS Take by mouth daily.    . Cyanocobalamin (VITAMIN B-12 CR) 1000 MCG TBCR Take by mouth daily.    . Magnesium 250 MG TABS Take by mouth daily.    . NON FORMULARY Black stallion    . NON FORMULARY gsh-3 cell defense    . NON FORMULARY True curcumin    . NON FORMULARY resetigen d    . Nutritional Supplements (OSTEO ADVANCE PO) Take by mouth daily.    . Omega-3 Fatty Acids (FISH OIL) 1200 MG CAPS Take by mouth daily.    . simvastatin (ZOCOR) 10 MG  tablet Take 10 mg by mouth every evening.  0   No current facility-administered medications for this visit.    Review of Systems Review of Systems  Constitutional: Negative.   Respiratory: Negative.   Cardiovascular: Negative.     Blood pressure 112/70, pulse 78, resp. rate 18, height  (1.676 m), weight 166 lb (75.297 kg).  Physical Exam Physical Exam  Constitutional: He is oriented to person, place, and time. He appears well-developed and well-nourished.  HENT:  Mouth/Throat: Oropharynx is clear and moist.  Eyes: Conjunctivae are normal. No scleral icterus.  Neurological: He is alert and oriented to person, place, and time.  Skin: Skin is warm and dry.  Psychiatric: His behavior is normal.    Data Reviewed CT scan reviewed.  Assessment    CT scan showed no evidence of a hernia or other abnormality in the groin or lower abdomen. Since last seen he has no further pain in the lower abdomen, right groin or right testicle.    Plan    Follow up as needed or if symptoms return. The patient is  aware to call back for any questions or concerns.     PCP:  Conard Novak M 07/09/2015, 4:59 PM

## 2015-07-09 NOTE — Patient Instructions (Signed)
The patient is aware to call back for any questions or concerns.  

## 2015-07-10 ENCOUNTER — Encounter: Payer: Self-pay | Admitting: General Surgery

## 2015-07-30 ENCOUNTER — Encounter: Payer: Self-pay | Admitting: General Surgery

## 2016-02-28 ENCOUNTER — Other Ambulatory Visit: Payer: Self-pay | Admitting: Endocrinology

## 2016-02-28 DIAGNOSIS — I420 Dilated cardiomyopathy: Secondary | ICD-10-CM

## 2016-03-10 ENCOUNTER — Ambulatory Visit
Admission: RE | Admit: 2016-03-10 | Discharge: 2016-03-10 | Disposition: A | Discharge status: Home / Self Care | Payer: Medicare Other | Source: Ambulatory Visit | Attending: Endocrinology | Admitting: Endocrinology

## 2016-03-10 DIAGNOSIS — I420 Dilated cardiomyopathy: Secondary | ICD-10-CM | POA: Diagnosis not present

## 2016-03-10 NOTE — Progress Notes (Signed)
*  PRELIMINARY RESULTS* Echocardiogram 2D Echocardiogram has been performed.  Georgann HousekeeperJerry R Hege 03/10/2016, 11:09 AM

## 2016-05-28 IMAGING — CT CT ABD-PELV W/ CM
1 of 3 series · 13 of 32 positions shown, 18 images · IV contrast (omnipaque)
Comparison: None.

CLINICAL DATA: Evaluate for recurrent hernia. History of hernia
repair x2 Initial encounter.

EXAM:
CT ABDOMEN AND PELVIS WITH CONTRAST
TECHNIQUE: Multidetector CT imaging of the abdomen and pelvis was performed
using the standard protocol following bolus administration of
intravenous contrast.
CONTRAST:  100mL OMNIPAQUE IOHEXOL 300 MG/ML  SOLN

[Series 2: routine abd pel with · axial · 0.74mm/px · z∈[-884,-500]mm · 13 of 87 slices shown, 18 images]
[im 5/87  soft-tissue]
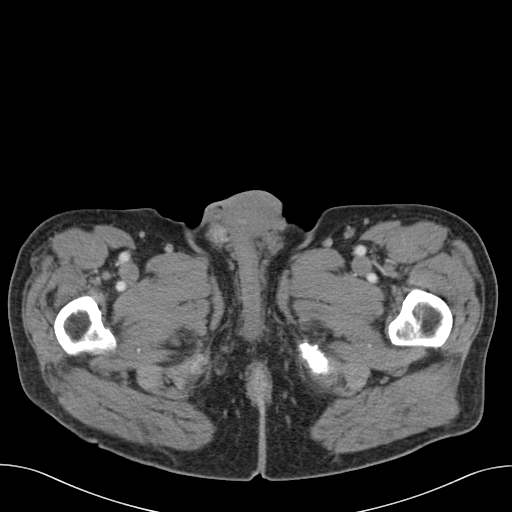
[im 5/87  bone]
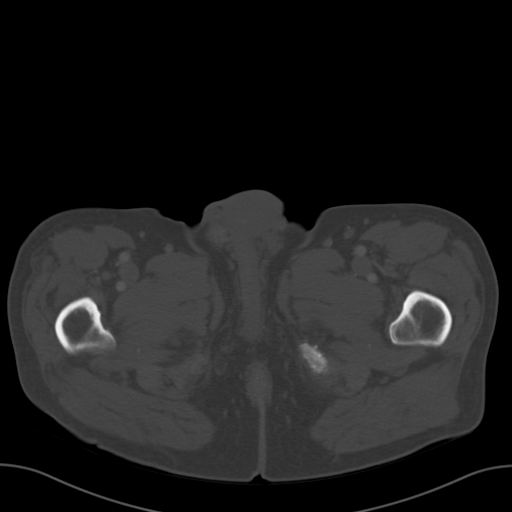
[im 13/87  soft-tissue]
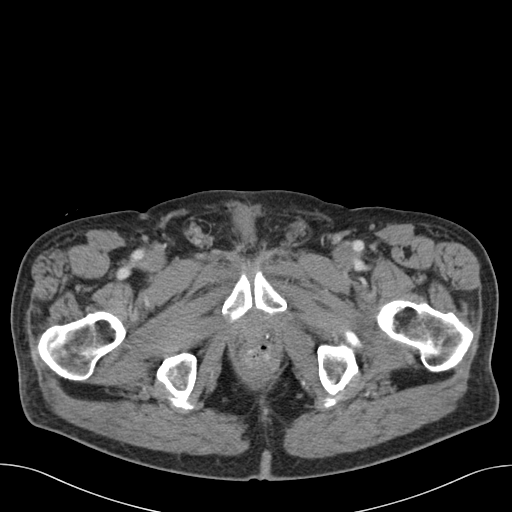
[im 18/87  soft-tissue]
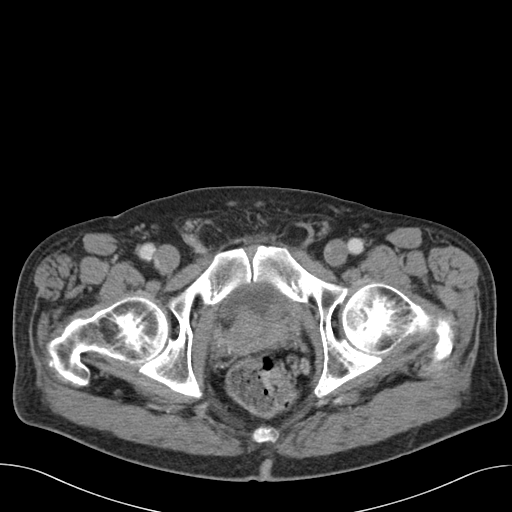
[im 26/87  soft-tissue]
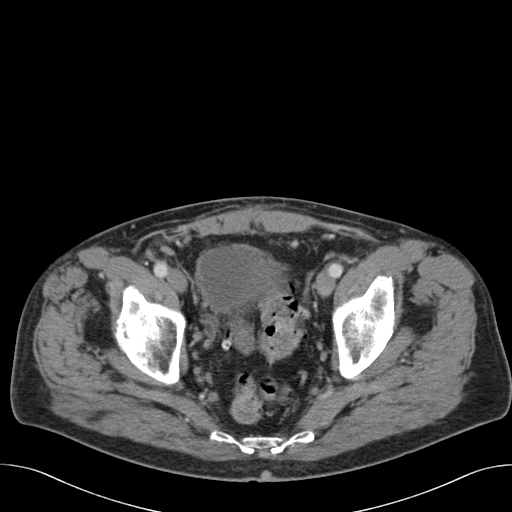
[im 35/87  soft-tissue]
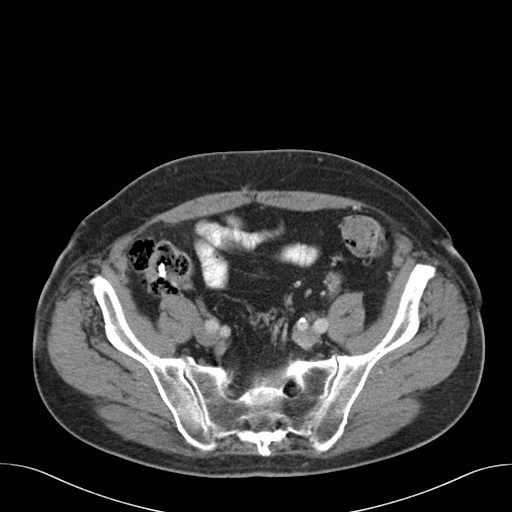
[im 39/87  soft-tissue]
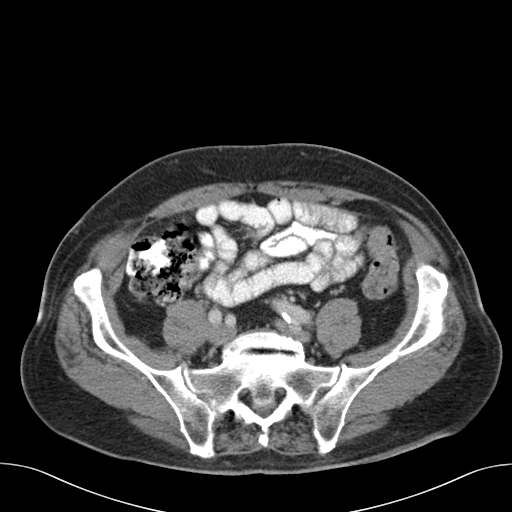
[im 48/87  soft-tissue]
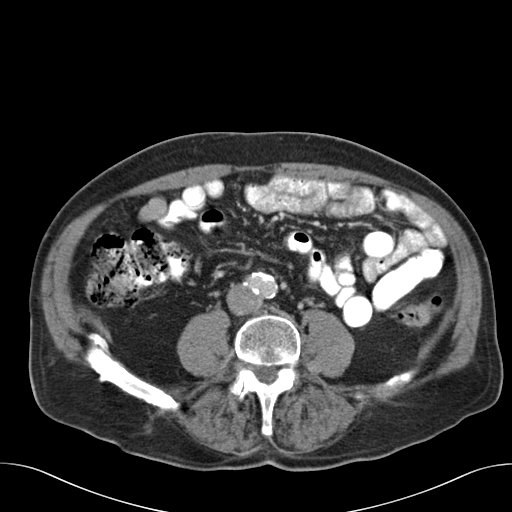
[im 52/87  soft-tissue]
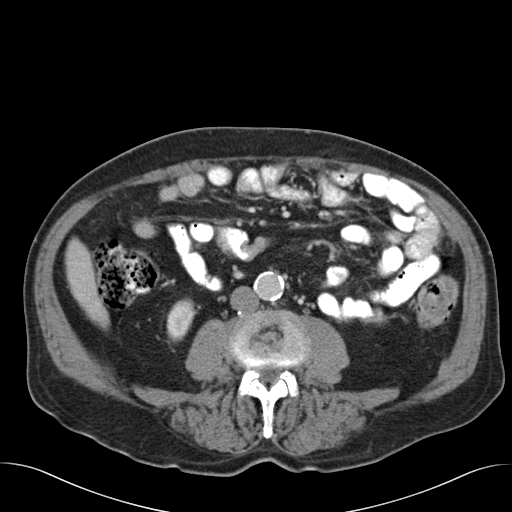
[im 61/87  soft-tissue]
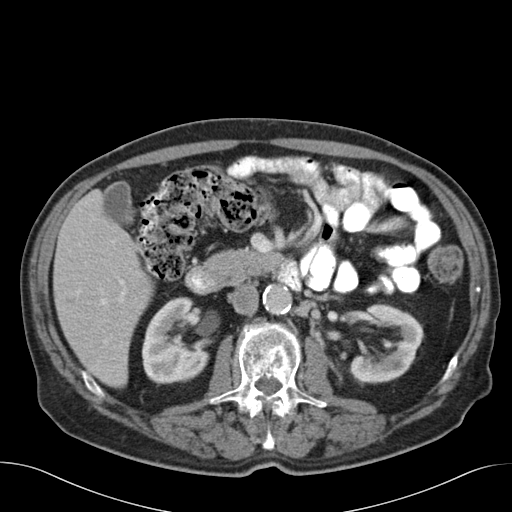
[im 61/87  bone]
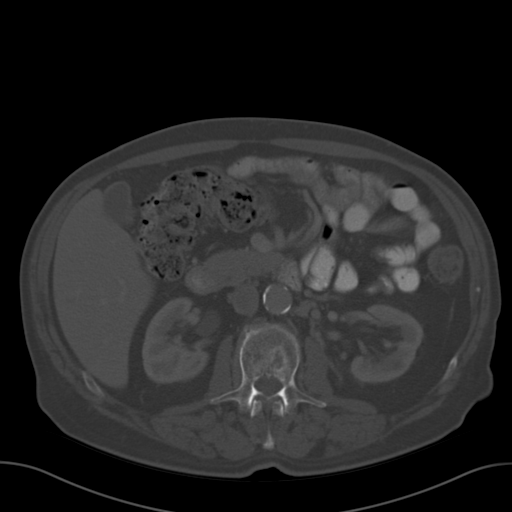
[im 69/87  soft-tissue]
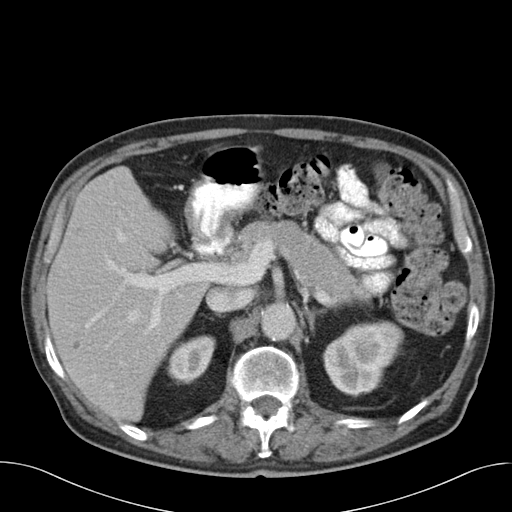
[im 69/87  lung]
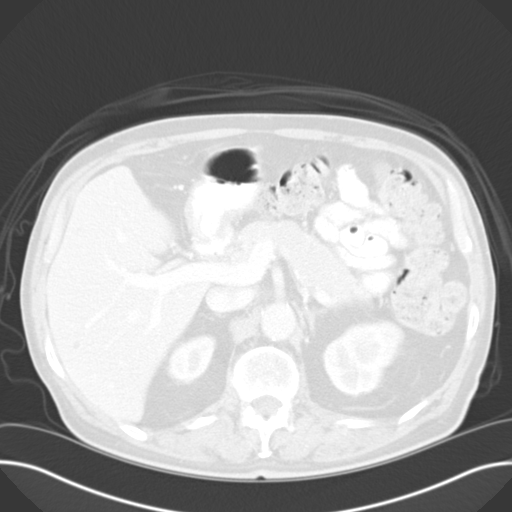
[im 74/87  soft-tissue]
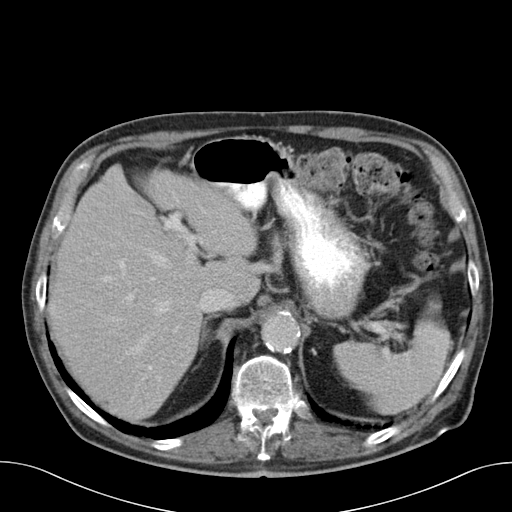
[im 74/87  lung]
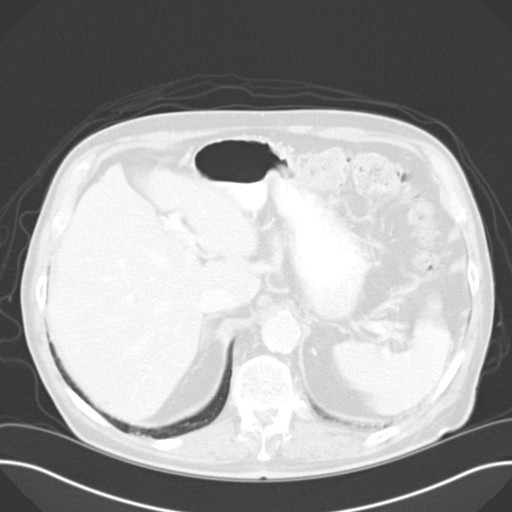
[im 78/87  lung]
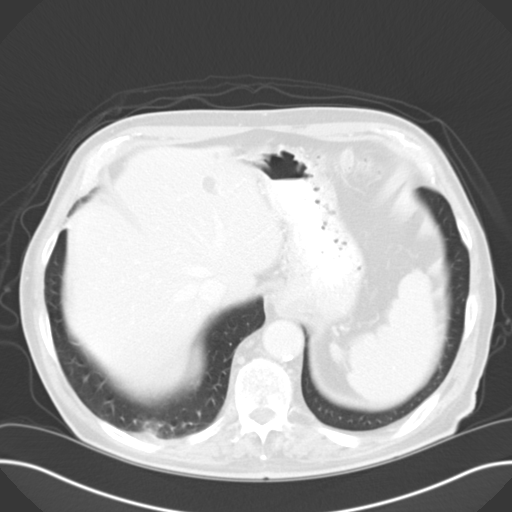
[im 82/87  soft-tissue]
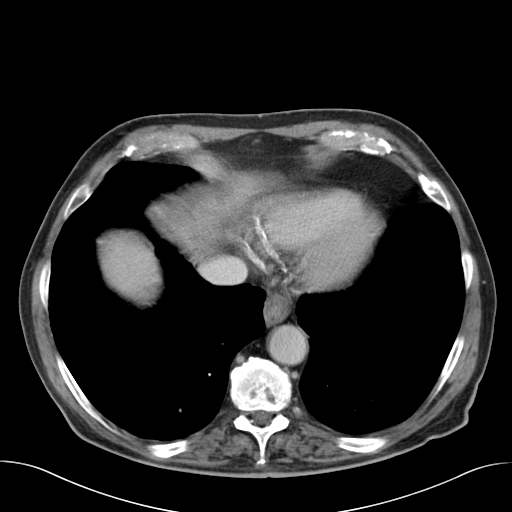
[im 82/87  lung]
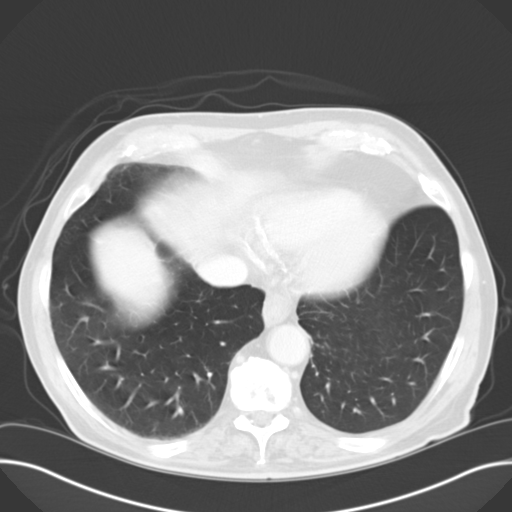

[13 of 32 positions shown; findings below may reference images not displayed]

FINDINGS: Lower chest: Mild dependent atelectasis or scarring in the right
lower lobe. The lung bases are otherwise clear. There is no pleural
or pericardial effusion. A small hiatal hernia and atherosclerosis
of the thoracic aorta and coronary arteries noted.

Hepatobiliary: There are multiple well-circumscribed low-density
hepatic lesions most consistent with cysts. No suspicious liver
lesions demonstrated. No evidence of gallstones, gallbladder wall
thickening or biliary dilatation.

Pancreas: Unremarkable. No pancreatic ductal dilatation or
surrounding inflammatory changes.

Spleen: Normal in size without focal abnormality.

Adrenals/Urinary Tract: Both adrenal glands appear normal. There are
bilateral renal cysts, measuring up to 2.7 cm in the lower pole of
the left kidney. No evidence of hydronephrosis, renal mass or
urinary tract calculus. Mild bladder wall thickening.

Stomach/Bowel: No evidence of bowel wall thickening, distention or
surrounding inflammatory change. There are sigmoid colon
diverticular changes with moderate stool throughout the colon.

Vascular/Lymphatic: There are no enlarged abdominal or pelvic lymph
nodes. Moderate atherosclerosis of the aorta, its branches and the
iliac arteries.

Reproductive: The prostate gland appears mildly nodular without
significant enlargement. There is probable seminal vesicle cyst
formation.

Other: Prominent fat in both inguinal canals,
right-greater-than-left. There is no herniated bowel. The anterior
abdominal wall w appears unremarkable.

Musculoskeletal: No acute or significant osseous findings. Mild
lumbar spondylosis.
IMPRESSION: 1. There are small bilateral inguinal hernias containing only fat,
slightly larger on the right.
2. Moderate atherosclerosis.
3. Hepatic and bilateral renal cysts.
4. No acute abdominal pelvic findings.

## 2020-10-28 ENCOUNTER — Other Ambulatory Visit: Payer: Self-pay

## 2020-10-28 ENCOUNTER — Other Ambulatory Visit
Admission: RE | Admit: 2020-10-28 | Discharge: 2020-10-28 | Disposition: A | Discharge status: Home / Self Care | Payer: Medicare Other | Source: Ambulatory Visit | Attending: Cardiology | Admitting: Cardiology

## 2020-10-28 DIAGNOSIS — Z01812 Encounter for preprocedural laboratory examination: Secondary | ICD-10-CM | POA: Diagnosis present

## 2020-10-28 DIAGNOSIS — Z20822 Contact with and (suspected) exposure to covid-19: Secondary | ICD-10-CM | POA: Insufficient documentation

## 2020-10-28 LAB — SARS CORONAVIRUS 2 (TAT 6-24 HRS): SARS Coronavirus 2: NEGATIVE

## 2020-10-30 ENCOUNTER — Encounter: Payer: Self-pay | Admitting: Cardiology

## 2020-10-30 ENCOUNTER — Encounter
Admission: RE | Disposition: A | Discharge status: Home / Self Care | Payer: Self-pay | Source: Home / Self Care | Attending: Cardiology

## 2020-10-30 ENCOUNTER — Ambulatory Visit
Admission: RE | Admit: 2020-10-30 | Discharge: 2020-10-30 | Disposition: A | Discharge status: Home / Self Care | Payer: Medicare Other | Attending: Cardiology | Admitting: Cardiology

## 2020-10-30 ENCOUNTER — Other Ambulatory Visit: Payer: Self-pay

## 2020-10-30 DIAGNOSIS — R569 Unspecified convulsions: Secondary | ICD-10-CM | POA: Insufficient documentation

## 2020-10-30 DIAGNOSIS — Z79899 Other long term (current) drug therapy: Secondary | ICD-10-CM | POA: Diagnosis not present

## 2020-10-30 DIAGNOSIS — Z87891 Personal history of nicotine dependence: Secondary | ICD-10-CM | POA: Insufficient documentation

## 2020-10-30 DIAGNOSIS — I2510 Atherosclerotic heart disease of native coronary artery without angina pectoris: Secondary | ICD-10-CM | POA: Insufficient documentation

## 2020-10-30 DIAGNOSIS — R55 Syncope and collapse: Secondary | ICD-10-CM | POA: Insufficient documentation

## 2020-10-30 DIAGNOSIS — I429 Cardiomyopathy, unspecified: Secondary | ICD-10-CM

## 2020-10-30 DIAGNOSIS — Z7902 Long term (current) use of antithrombotics/antiplatelets: Secondary | ICD-10-CM | POA: Insufficient documentation

## 2020-10-30 HISTORY — DX: Syncope and collapse: R55

## 2020-10-30 HISTORY — PX: LOOP RECORDER INSERTION: EP1214

## 2020-10-30 HISTORY — DX: Cardiac arrhythmia, unspecified: I49.9

## 2020-10-30 SURGERY — LOOP RECORDER INSERTION
Anesthesia: LOCAL

## 2020-10-30 MED ORDER — LIDOCAINE-EPINEPHRINE (PF) 1 %-1:200000 IJ SOLN
INTRAMUSCULAR | Status: DC | PRN
  Administered 2020-10-30: 20 mL

## 2020-10-30 MED ORDER — LIDOCAINE HCL (PF) 1 % IJ SOLN
INTRAMUSCULAR | Status: AC
  Filled 2020-10-30: qty 30

## 2020-10-30 SURGICAL SUPPLY — 2 items
LOOP REVEAL LINQ LNQ11 (Prosthesis & Implant Heart) ×1 IMPLANT
PACK LOOP INSERTION (CUSTOM PROCEDURE TRAY) ×1 IMPLANT

## 2021-03-17 ENCOUNTER — Emergency Department: Payer: Medicare Other

## 2021-03-17 ENCOUNTER — Other Ambulatory Visit: Payer: Self-pay

## 2021-03-17 ENCOUNTER — Observation Stay
Admission: EM | Admit: 2021-03-17 | Discharge: 2021-03-18 | Disposition: A | Discharge status: Home / Self Care | Payer: Medicare Other | Attending: Internal Medicine | Admitting: Internal Medicine

## 2021-03-17 DIAGNOSIS — J690 Pneumonitis due to inhalation of food and vomit: Secondary | ICD-10-CM | POA: Diagnosis not present

## 2021-03-17 DIAGNOSIS — Z20822 Contact with and (suspected) exposure to covid-19: Secondary | ICD-10-CM | POA: Insufficient documentation

## 2021-03-17 DIAGNOSIS — Z7902 Long term (current) use of antithrombotics/antiplatelets: Secondary | ICD-10-CM | POA: Diagnosis not present

## 2021-03-17 DIAGNOSIS — K219 Gastro-esophageal reflux disease without esophagitis: Secondary | ICD-10-CM | POA: Diagnosis not present

## 2021-03-17 DIAGNOSIS — R0602 Shortness of breath: Secondary | ICD-10-CM

## 2021-03-17 DIAGNOSIS — Z79899 Other long term (current) drug therapy: Secondary | ICD-10-CM | POA: Diagnosis not present

## 2021-03-17 DIAGNOSIS — Z87891 Personal history of nicotine dependence: Secondary | ICD-10-CM | POA: Insufficient documentation

## 2021-03-17 DIAGNOSIS — R0902 Hypoxemia: Secondary | ICD-10-CM

## 2021-03-17 DIAGNOSIS — J9600 Acute respiratory failure, unspecified whether with hypoxia or hypercapnia: Secondary | ICD-10-CM | POA: Diagnosis present

## 2021-03-17 DIAGNOSIS — T17908A Unspecified foreign body in respiratory tract, part unspecified causing other injury, initial encounter: Secondary | ICD-10-CM | POA: Diagnosis present

## 2021-03-17 LAB — RESP PANEL BY RT-PCR (FLU A&B, COVID) ARPGX2
Influenza A by PCR: NEGATIVE
Influenza B by PCR: NEGATIVE
SARS Coronavirus 2 by RT PCR: NEGATIVE

## 2021-03-17 LAB — CBC
HCT: 40.5 % (ref 39.0–52.0)
Hemoglobin: 13.3 g/dL (ref 13.0–17.0)
MCH: 30.6 pg (ref 26.0–34.0)
MCHC: 32.8 g/dL (ref 30.0–36.0)
MCV: 93.1 fL (ref 80.0–100.0)
Platelets: 200 10*3/uL (ref 150–400)
RBC: 4.35 MIL/uL (ref 4.22–5.81)
RDW: 14.5 % (ref 11.5–15.5)
WBC: 12.7 10*3/uL — ABNORMAL HIGH (ref 4.0–10.5)
nRBC: 0 % (ref 0.0–0.2)

## 2021-03-17 LAB — BASIC METABOLIC PANEL
Anion gap: 9 (ref 5–15)
BUN: 32 mg/dL — ABNORMAL HIGH (ref 8–23)
CO2: 21 mmol/L — ABNORMAL LOW (ref 22–32)
Calcium: 9 mg/dL (ref 8.9–10.3)
Chloride: 107 mmol/L (ref 98–111)
Creatinine, Ser: 1.64 mg/dL — ABNORMAL HIGH (ref 0.61–1.24)
GFR, Estimated: 39 mL/min — ABNORMAL LOW (ref >60)
Glucose, Bld: 129 mg/dL — ABNORMAL HIGH (ref 70–99)
Potassium: 4.2 mmol/L (ref 3.5–5.1)
Sodium: 137 mmol/L (ref 135–145)

## 2021-03-17 LAB — LACTIC ACID, PLASMA: Lactic Acid, Venous: 1.9 mmol/L (ref 0.5–1.9)

## 2021-03-17 MED ORDER — CLOPIDOGREL BISULFATE 75 MG PO TABS
75.0000 mg | ORAL_TABLET | Freq: Every day | ORAL | Status: DC
  Administered 2021-03-17: 75 mg via ORAL
  Filled 2021-03-17: qty 1

## 2021-03-17 MED ORDER — ENOXAPARIN SODIUM 40 MG/0.4ML IJ SOSY
40.0000 mg | PREFILLED_SYRINGE | INTRAMUSCULAR | Status: DC
  Administered 2021-03-17: 40 mg via SUBCUTANEOUS
  Filled 2021-03-17: qty 0.4

## 2021-03-17 MED ORDER — SIMVASTATIN 20 MG PO TABS
20.0000 mg | ORAL_TABLET | Freq: Every day | ORAL | Status: DC
  Administered 2021-03-17: 20 mg via ORAL
  Filled 2021-03-17 (×2): qty 1

## 2021-03-17 MED ORDER — MONTELUKAST SODIUM 10 MG PO TABS
10.0000 mg | ORAL_TABLET | Freq: Every day | ORAL | Status: DC
  Administered 2021-03-18: 10 mg via ORAL
  Filled 2021-03-17 (×2): qty 1

## 2021-03-17 MED ORDER — METOPROLOL SUCCINATE ER 25 MG PO TB24
25.0000 mg | ORAL_TABLET | Freq: Every day | ORAL | Status: DC
  Administered 2021-03-18: 25 mg via ORAL
  Filled 2021-03-17: qty 1

## 2021-03-17 MED ORDER — CEFTRIAXONE SODIUM 2 G IJ SOLR: 2.0000 g | INTRAMUSCULAR | Status: DC

## 2021-03-17 MED ORDER — SODIUM CHLORIDE 0.9 % IV SOLN: 500.0000 mg | INTRAVENOUS | Status: DC

## 2021-03-17 MED ORDER — SODIUM CHLORIDE 0.9 % IV SOLN: Freq: Once | INTRAVENOUS | Status: AC

## 2021-03-17 MED ORDER — SODIUM CHLORIDE 0.9 % IV SOLN
1.0000 g | Freq: Once | INTRAVENOUS | Status: DC
  Filled 2021-03-17: qty 10

## 2021-03-17 MED ORDER — LACTATED RINGERS IV BOLUS
1000.0000 mL | Freq: Once | INTRAVENOUS | Status: AC
  Administered 2021-03-17: 1000 mL via INTRAVENOUS

## 2021-03-17 MED ORDER — SODIUM CHLORIDE 0.9 % IV SOLN
3.0000 g | Freq: Four times a day (QID) | INTRAVENOUS | Status: DC
  Administered 2021-03-17 – 2021-03-18 (×3): 3 g via INTRAVENOUS
  Filled 2021-03-17: qty 8
  Filled 2021-03-17 (×2): qty 3
  Filled 2021-03-17: qty 8
  Filled 2021-03-17: qty 3
  Filled 2021-03-17: qty 8

## 2021-03-17 MED ORDER — SODIUM CHLORIDE 0.9 % IV SOLN
500.0000 mg | Freq: Once | INTRAVENOUS | Status: DC
  Filled 2021-03-17: qty 500

## 2021-03-17 MED ORDER — SODIUM CHLORIDE 0.9 % IV SOLN
3.0000 g | Freq: Once | INTRAVENOUS | Status: AC
  Administered 2021-03-17: 3 g via INTRAVENOUS
  Filled 2021-03-17: qty 8

## 2021-03-17 MED ORDER — SODIUM CHLORIDE 0.9 % IV SOLN: INTRAVENOUS | Status: DC | PRN

## 2021-03-17 NOTE — ED Notes (Signed)
Patients gold chain and gold chain necklace with diamond charms placed in patient belongings cup at bedside. Patient made aware of jewelry in cup

## 2021-03-17 NOTE — H&P (Signed)
History and Physical    Alexander PintoDon P Viereck ZOX:[id number] DOB: 04/22/1928 DOA: 03/17/2021  PCP: Alan MulderMorayati, Shamil J, MD   Patient coming from: Home  I have personally briefly reviewed patient's [facility]  Chief Complaint: Cough/fever/chills  HPI: Alexander Webster is a 87 y.o. male with medical history significant for syncope who presents to the ER for evaluation of shortness of breath as well as fever and chills.  Onset of symptoms was in the early hours of the morning of his admission.  Patient states that he had a heavy meal for dinner and then ate a whole box of ice cream.  He went to bed and woke up in the early hours of the morning with multiple episodes of nonbloody, nonbilious emesis.  He thinks he may have choked on his emesis and states that he went back to bed but woke up this morning with chills, low-grade fever, cough and myalgias. Patient presented to the ER where he was found to have room air pulse oximetry of 88% that improved on 2 L of oxygen. He denies having any chest pain, no abdominal pain, no changes in his bowel habits, no urinary symptoms, no blurred vision, no focal deficits, no palpitations, no diaphoresis, no dizziness or lightheadedness. Labs show sodium 137, potassium 4.2, chloride 107, bicarb 21, glucose 129, BUN 32, creatinine 1.6 calcium 9.0, lactic acid 1.9, white count 12.7, hemoglobin 13.3, hematocrit 40.5, MCV 93.1, RDW 14.5, platelet count 200 Respiratory viral panel is negative Chest x-ray reviewed by me shows patchy airspace opacity left mid and lower lung regions. Question pneumonia or aspiration. Both entities may be present concurrently. Mild right base atelectasis. Twelve-lead EKG reviewed by me shows sinus rhythm, left bundle branch block and left axis deviation.   ED Course: Patient is a 87 year old Caucasian male who looks younger than his stated age and presents to the ER for evaluation of shortness of breath, cough associated with  fever and chills.  Patient states that he had multiple episodes of emesis overnight and thinks he may have choked while he was throwing up.  Chest x-ray shows findings consistent with possible aspiration.  His room air pulse oximetry upon arrival 88% and he is currently on 2 L of oxygen.  Patient received antibiotics in the ER and will be admitted to the hospital for further evaluation.    Review of Systems: As per HPI otherwise all other systems reviewed and negative.    Past Medical History:  Diagnosis Date  . Dysrhythmia    possible cause of syncope-   . Leg fracture   . Syncope     Past Surgical History:  Procedure Laterality Date  . HERNIA REPAIR Bilateral 05-24-12   Dr Evette CristalSankar  . LEG SURGERY Right 02/08/2015  . LOOP RECORDER INSERTION N/A 10/30/2020   Procedure: LOOP RECORDER INSERTION;  Surgeon: Marcina MillardParaschos, Alexander, MD;  Location: [facility] INVASIVE CV LAB;  Service: Cardiovascular;  Laterality: N/A;  . MUSCLE REPAIR Right    calf  . ruptured disc     ruptured disc repair     reports that he quit smoking about 26 years ago. His smoking use included cigarettes and pipe. He has a 20.00 pack-year smoking history. He has never used smokeless tobacco. He reports that he does not drink alcohol and does not use drugs.  Allergies  Allergen Reactions  . Percocet [Oxycodone-Acetaminophen] Itching    Itching  . Vicodin [Hydrocodone-Acetaminophen] Itching    Family History  Problem Relation  Age of Onset  . Heart disease Mother   . Cancer Mother        lymphoma  . Cancer Father        prostate      Prior to Admission medications   Medication Sig Start Date End Date Taking? Authorizing Provider  clopidogrel (PLAVIX) 75 MG tablet Take 75 mg by mouth at bedtime. 08/15/20  Yes [provider]  Misc Natural Products (ADV TURMERIC CURCUMIN COMPLEX PO) Take 1 capsule by mouth in the morning and at bedtime. Primal Force Curcumin Triple Burn   Yes [provider]   montelukast (SINGULAIR) 10 MG tablet Take 10 mg by mouth daily. 08/15/20  Yes [provider]  simvastatin (ZOCOR) 20 MG tablet Take 20 mg by mouth at bedtime. 09/14/20  Yes [provider]  DHA-EPA-Vitamin E (OMEGA-3 COMPLEX PO) Take 1 capsule by mouth daily. Primal Force Omega Rejuvenol Patient not taking: No sig reported    [provider]  Menthol, Topical Analgesic, (BENGAY EX) Apply 1 application topically 3 (three) times daily as needed. Patient not taking: Reported on 03/17/2021    [provider]    Physical Exam: Vitals:   03/17/21 0911 03/17/21 0945 03/17/21 1000 03/17/21 1145  BP: (!) 110/57 (!) 107/57 (!) 106/51 102/62  Pulse: 98 86 86 84  Resp: (!) 22 (!) 31 (!) 29 18  Temp: 99.1 F (37.3 C)     SpO2: (!) 88% 96% 96% 94%  Weight:      Height:         Vitals:   03/17/21 0911 03/17/21 0945 03/17/21 1000 03/17/21 1145  BP: (!) 110/57 (!) 107/57 (!) 106/51 102/62  Pulse: 98 86 86 84  Resp: (!) 22 (!) 31 (!) 29 18  Temp: 99.1 F (37.3 C)     SpO2: (!) 88% 96% 96% 94%  Weight:      Height:          Constitutional: Alert and oriented x 3 . Not in any apparent distress HEENT:      Head: Normocephalic and atraumatic.         Eyes: PERLA, EOMI, Conjunctivae are normal. Sclera is non-icteric.       Mouth/Throat: Mucous membranes are moist.       Neck: Supple with no signs of meningismus. Cardiovascular: Regular rate and rhythm. No murmurs, gallops, or rubs. 2+ symmetrical distal pulses are present . No JVD. No LE edema Respiratory: Respiratory effort normal.  Scattered rhonchi over left lung fields . No wheezes or crackles Gastrointestinal: Soft, non tender, and non distended with positive bowel sounds.  Genitourinary: No CVA tenderness. Musculoskeletal: Nontender with normal range of motion in all extremities. No cyanosis, or erythema of extremities. Neurologic:  Face is symmetric. Moving all extremities. No gross focal neurologic  deficits . Skin: Skin is warm, dry.  No rash or ulcers Psychiatric: Mood and affect are normal   Labs on Admission: I have personally reviewed following labs and imaging studies  CBC: Recent Labs  Lab 03/17/21 0912  WBC 12.7*  HGB 13.3  HCT 40.5  MCV 93.1  PLT 200   Basic Metabolic Panel: Recent Labs  Lab 03/17/21 0912  NA 137  K 4.2  CL 107  CO2 21*  GLUCOSE 129*  BUN 32*  CREATININE 1.64*  CALCIUM 9.0   GFR: Estimated Creatinine Clearance: 34.6 mL/min (A) (by C-G formula based on SCr of 1.64 mg/dL (H)). Liver Function Tests: No results for input(s): AST,  ALT, ALKPHOS, BILITOT, PROT, ALBUMIN in the last 168 hours. No results for input(s): LIPASE, AMYLASE in the last 168 hours. No results for input(s): AMMONIA in the last 168 hours. Coagulation Profile: No results for input(s): INR, PROTIME in the last 168 hours. Cardiac Enzymes: No results for input(s): CKTOTAL, CKMB, CKMBINDEX, TROPONINI in the last 168 hours. BNP (last 3 results) No results for input(s): PROBNP in the last 8760 hours. HbA1C: No results for input(s): HGBA1C in the last 72 hours. CBG: No results for input(s): GLUCAP in the last 168 hours. Lipid Profile: No results for input(s): CHOL, HDL, LDLCALC, TRIG, CHOLHDL, LDLDIRECT in the last 72 hours. Thyroid Function Tests: No results for input(s): TSH, T4TOTAL, FREET4, T3FREE, THYROIDAB in the last 72 hours. Anemia Panel: No results for input(s): VITAMINB12, FOLATE, FERRITIN, TIBC, IRON, RETICCTPCT in the last 72 hours. Urine analysis:    Component Value Date/Time   COLORURINE Yellow 02/09/2015 0054   APPEARANCEUR Clear 02/09/2015 0054   LABSPEC 1.028 02/09/2015 0054   PHURINE 5.0 02/09/2015 0054   GLUCOSEU Negative 02/09/2015 0054   HGBUR Negative 02/09/2015 0054   BILIRUBINUR Negative 02/09/2015 0054   KETONESUR Trace 02/09/2015 0054   PROTEINUR Negative 02/09/2015 0054   NITRITE Negative 02/09/2015 0054   LEUKOCYTESUR Negative 02/09/2015  0054    Radiological Exams on Admission: DG Chest 2 View  Result Date: 03/17/2021 CLINICAL DATA:  Shortness of breath.  Reported episode of aspiration EXAM: CHEST - 2 VIEW COMPARISON:  February 08, 2015 FINDINGS: There is patchy airspace opacity in the left mid and lower lung regions. There is mild atelectatic change in the right base. Lungs elsewhere clear. Heart is upper normal in size with pulmonary vascularity normal. There is aortic atherosclerosis. There is a loop recorder on the left. No bone lesions. IMPRESSION: Patchy airspace opacity left mid and lower lung regions. Question pneumonia or aspiration. Both entities may be present concurrently. Mild right base atelectasis. Heart upper normal in size. Loop recorder on left. Aortic Atherosclerosis (ICD10-I70.0). Electronically Signed   By: Bretta Bang III M.D.   On: 03/17/2021 09:46     Assessment/Plan Principal Problem:   Aspiration pneumonia (HCC) Active Problems:   Acute respiratory failure (HCC)     Acute respiratory failure secondary to aspiration pneumonia Patient presents to the ER for evaluation of shortness of breath, cough associated with fever and chills. He had multiple episodes of emesis overnight and thinks he may have choked while he was throwing up Imaging is suggestive of possible aspiration pneumonia We will place patient on Unasyn Request speech therapy consult Patient had room air pulse oximetry of 88% upon arrival to the ER is currently on 2 L of oxygen Keep pulse oximetry greater than 92% We will attempt to wean off oxygen once his acute illness has resolved. DVT prophylaxis: Lovenox Code Status: full code Family Communication: Greater than 50% of time was spent discussing patient's condition and plan of care with him at the bedside.  All questions and concerns have been addressed.  He verbalizes understanding and agrees with the plan.  CODE STATUS was discussed and he is a full code Disposition Plan: Back  to previous home environment Consults called: none Status: At the time of admission, it appears that the appropriate admission status for this patient is inpatient. This is judged to be reasonable and necessary in order to provide the required intensity of service to ensure the patient's safety given the presenting symptoms, physical exam findings and initial radiographic and laboratory  data in the context of their comorbid conditions. Patient requires inpatient status due to high intensity of service, high risk for further deterioration and high frequency of surveillance required.    Lucile Shutters MD [facility]     03/17/2021, 12:27 PM

## 2021-03-17 NOTE — Progress Notes (Signed)
BP is 92/55, MAP 67 HR 72. Dr Joylene Igo notified. No new orders at this time

## 2021-03-17 NOTE — Evaluation (Signed)
Clinical/Bedside Swallow Evaluation Patient Details  Name: Alexander Webster MRN: [id number] Date of Birth: 1927-12-26  Today's Date: 03/17/2021 Time: SLP Start Time (ACUTE ONLY): 1500 SLP Stop Time (ACUTE ONLY): 1600 SLP Time Calculation (min) (ACUTE ONLY): 60 min  Past Medical History:  Past Medical History:  Diagnosis Date  . Dysrhythmia    possible cause of syncope-   . Leg fracture   . Syncope    Past Surgical History:  Past Surgical History:  Procedure Laterality Date  . HERNIA REPAIR Bilateral 05-24-12   Dr Evette Cristal  . LEG SURGERY Right 02/08/2015  . LOOP RECORDER INSERTION N/A 10/30/2020   Procedure: LOOP RECORDER INSERTION;  Surgeon: Marcina Millard, MD;  Location: [facility] INVASIVE CV LAB;  Service: Cardiovascular;  Laterality: N/A;  . MUSCLE REPAIR Right    calf  . ruptured disc     ruptured disc repair   HPI:  Pt is a 87 y.o. male with medical history significant for syncope who presents to the ER for evaluation of shortness of breath as well as fever and chills.  Onset of symptoms was in the early hours of this morning of his admission.  Patient states that he had a "heavy" meal of Fried Fish, et al for dinner and then ate a whole box of ice cream later in the evening.  He went to bed and woke up in the early hours of the morning with multiple episodes of nonbloody, nonbilious emesis.  He thinks he may have choked on his emesis and states that he went back to bed but woke up later in the morning with chills, low-grade fever, cough and myalgias.  Patient presented to the ER where he was found to have room air pulse oximetry of 88% that improved on 2 L of oxygen.  CXR: "Patchy airspace opacity left mid and lower lung regions. Question  pneumonia or aspiration.".  OF NOTE: pt has a Baseline of GERD on Nexium 40mg , PPI at home.  In 2015, [place] Bar[place] swallow [place] (GI) revealed "changes of presbyesophagus. There was mild luminal narrowing at the level of the non-reducible hiatal hernia.".   Pt endorsed s/s of ongoing REFLUX at home prior to this event; he is not followed by GI currently.  Pt also stated he had his Esophagus "Stretched about 5 years ago".   Assessment / Plan / Recommendation Clinical Impression  Pt appears to present w/ adequate oropharyngeal phase swallowing function w/ No overt oropharyngeal phase dysphagia appreciated w/ trials accepted; No neuromuscular swallowing deficits appreciated. Pt is at reduced risk for aspiration from an oropharyngeal phase standpoint following general aspiration precautions. Pt has a baseline of REFLUX, on a PPI at home. ANY Dysmotility or Regurgitation of Reflux material can increase risk for aspiration of the Reflux material during Retrograde flow thus impact Pulmonary status. Pt described ongoing issues w/ s/s of REFLUX at home. Pt sat upright in bed post support to position the bed more fully upright then consumed trials of thin liquids Via Cup and Straw, puree, and soft solids moistened well w/ no immediate, overt clinical s/s of aspiration noted; clear vocal quality b/t trials, no decline in pulmonary status, no multiple swallows noted post initial pharyngeal swallow. Oral phase appeared [facility] for bolus management and timely A-P transfer/clearing of material. OM exam was [facility] for oral clearing; lingual/labial movements. No unilateral weakness. Speech clear. Of Note, pt demonstrated fairly quick s/s of Esophageal dysmotility and REFLUX: hiccups, belching.  Recommend continue Regluar diet (w/ less meats/breads in diet,  moistened foods) w/ thin liquids via Cup/less straw use d/t air swallowing; general aspiration precautions. Rest Breaks during meals/oral intake to allow for Esophageal clearing. REFLUX precautions strongly recommended to lessen chance for Regurgitation and more REFLUX diet of foods. Recommend pt f/u w/ GI for management of REFLUX and tx; assessment of Esophageal dysmotility and tx options (it has been ~5 years since pt had Dilation per  his report). Disucssion and handouts given on REFLUX, diet/foods to pt and Family. Thorough education given. MD updated and will reconsult ST services if any new needs while admitted. SLP Visit Diagnosis: Dysphagia, unspecified (R13.10) (Esophageal phase dysmotility)    Aspiration Risk   (reduced following general precautions)    Diet Recommendation  general Regular diet w/ cut meats/foods; moistened. Thin liquids w/ less straw use d/t air swallowed. STRICT REFLUX/GERD PRECAUTIONS. General aspiration precautions.  Medication Administration: Whole meds with puree (for easier swallowing)    Other  Recommendations Recommended Consults: Consider GI evaluation;Consider esophageal assessment Oral Care Recommendations: Oral care BID;Oral care before and after PO;Patient independent with oral care Other Recommendations:  (n/a)   Follow up Recommendations None      Frequency and Duration  (n/a)   (n/a)       Prognosis Prognosis for Safe Diet Advancement: Good Barriers to Reach Goals:  (Esophageal phase dysmotility)      Swallow Study   General Date of Onset: 03/17/21 HPI: Pt is a 87 y.o. male with medical history significant for syncope who presents to the ER for evaluation of shortness of breath as well as fever and chills.  Onset of symptoms was in the early hours of this morning of his admission.  Patient states that he had a "heavy" meal of Fried Fish, et al for dinner and then ate a whole box of ice cream later in the evening.  He went to bed and woke up in the early hours of the morning with multiple episodes of nonbloody, nonbilious emesis.  He thinks he may have choked on his emesis and states that he went back to bed but woke up later in the morning with chills, low-grade fever, cough and myalgias.  Patient presented to the ER where he was found to have room air pulse oximetry of 88% that improved on 2 L of oxygen.  CXR: "Patchy airspace opacity left mid and lower lung regions. Question   pneumonia or aspiration.".  OF NOTE: pt has a Baseline of GERD on Nexium 40mg , PPI at home.  In [place], [place] swallow study ([place]) revealed "changes of presbyesophagus. There was mild luminal narrowing at the level of the non-reducible hiatal hernia.".  Pt endorsed s/s of ongoing REFLUX at home prior to this event; he is not followed by GI currently.  Pt also stated he had his Esophagus "Stretched about 5 years ago". Type of Study: Bedside Swallow Evaluation Previous Swallow Assessment: none Diet Prior to this Study: Regular;Thin liquids Temperature Spikes Noted: No (wbc 12.7) Respiratory Status: Nasal cannula (1-2L) History of Recent Intubation: No Behavior/Cognition: Alert;Cooperative;Pleasant mood Oral Cavity Assessment: Within Functional Limits Oral Care Completed by SLP: Yes Oral Cavity - Dentition: Adequate natural dentition (partials) Vision: Functional for self-feeding Self-Feeding Abilities: Able to feed self;Needs set up Patient Positioning: Upright in bed (needed positioning fully upright) Baseline Vocal Quality: Normal Volitional Cough: Strong Volitional Swallow: Able to elicit    Oral/Motor/Sensory Function Overall Oral Motor/Sensory Function: Within functional limits   Ice Chips Ice chips: Within functional limits Presentation: Spoon (fed; 2 trials)  Thin Liquid Thin Liquid: Within functional limits Presentation: Cup;Self Fed;Straw (5 trials via each)    Nectar Thick Nectar Thick Liquid: Not tested   Honey Thick Honey Thick Liquid: Not tested   Puree Puree: Within functional limits Presentation: Self Fed;Spoon (~4 ozs)   Solid     Solid: Within functional limits Presentation: Self Fed (8 trials) Other Comments: moistened foods        Jerilynn Som, MS, [employer] Speech Language Pathologist [facility] [phone] [facility] 03/17/2021,7:01 PM

## 2021-03-17 NOTE — ED Notes (Signed)
Informed RN bed assigned 

## 2021-03-17 NOTE — ED Provider Notes (Signed)
[facility] Emergency Department Provider Note ____________________________________________   Event Date/Time   First MD Initiated Contact with Patient 03/17/21 0930     (approximate)  I have reviewed the triage vital signs and the nursing notes.  HISTORY  Chief Complaint Shortness of Breath   HPI Alexander Webster is a 87 y.o. malewho presents to the ED for evaluation of shortness of breath, cough.   Chart review indicates chronic episodic syncopal episodes for which she has been evaluated by cardiology.  Known left bundle branch block.  Patient reports eating a large meal last night of barbecue and then following up with "a lot" of ice cream "which was an indulgence."  He reports subsequent nausea without significant abdominal pain, causing 2-3 episodes of nonbloody nonbilious emesis.  He reports he may have choked on this.  Reports sleeping thereafter, and waking up this morning with chills, cough and feeling poorly in a generalized fashion.  Continues to deny abdominal pain, denies any emesis this morning or diarrhea.  Past Medical History:  Diagnosis Date  . Dysrhythmia    possible cause of syncope-   . Leg fracture   . Syncope     Patient Active Problem List   Diagnosis Date Noted  . Aspiration pneumonia (HCC) 03/17/2021  . Cough 08/28/2014  . Vasomotor rhinitis 08/28/2014    Past Surgical History:  Procedure Laterality Date  . HERNIA REPAIR Bilateral 05-24-12   Dr Evette Cristal  . LEG SURGERY Right 02/08/2015  . LOOP RECORDER INSERTION N/A 10/30/2020   Procedure: LOOP RECORDER INSERTION;  Surgeon: Marcina Millard, MD;  Location: [facility] INVASIVE CV LAB;  Service: Cardiovascular;  Laterality: N/A;  . MUSCLE REPAIR Right    calf  . ruptured disc     ruptured disc repair    Prior to Admission medications   Medication Sig Start Date End Date Taking? Authorizing Provider  clopidogrel (PLAVIX) 75 MG tablet Take 75 mg by mouth at bedtime. 08/15/20    [provider]  DHA-EPA-Vitamin E (OMEGA-3 COMPLEX PO) Take 1 capsule by mouth daily. Primal Force Omega Rejuvenol Patient not taking: Reported on 10/30/2020    [provider]  Menthol, Topical Analgesic, (BENGAY EX) Apply 1 application topically 3 (three) times daily as needed.    [provider]  Misc Natural Products (ADV TURMERIC CURCUMIN COMPLEX PO) Take 1 capsule by mouth daily. Primal Force Curcumin Triple Burn    [provider]  montelukast (SINGULAIR) 10 MG tablet Take 10 mg by mouth at bedtime. 08/15/20   [provider]  simvastatin (ZOCOR) 20 MG tablet Take 20 mg by mouth at bedtime. 09/14/20   [provider]    Allergies Percocet [oxycodone-acetaminophen] and Vicodin [hydrocodone-acetaminophen]  Family History  Problem Relation Age of Onset  . Heart disease Mother   . Cancer Mother        lymphoma  . Cancer Father        prostate    Social History Social History   Tobacco Use  . Smoking status: Former Smoker    Packs/day: 1.00    Years: 20.00    Pack years: 20.00    Types: Cigarettes, Pipe    Quit date: 08/28/1994    Years since quitting: 26.5  . Smokeless tobacco: Never Used  . Tobacco comment: Smoked cigarettes, pipe.   Substance Use Topics  . Alcohol use: No    Alcohol/week: 0.0 standard drinks  . Drug use: No    Review of Systems  Constitutional: Positive for subjective fever/chills Eyes: No visual changes. ENT: No sore throat. Cardiovascular: Denies chest pain. Respiratory: Positive for productive cough and shortness of breath Gastrointestinal: No abdominal pain.  No nausea, no vomiting.  No diarrhea.  No constipation. Genitourinary: Negative for dysuria. Musculoskeletal: Negative for back pain. Skin: Negative for rash. Neurological: Negative for headaches, focal weakness or numbness. ____________________________________________  PHYSICAL EXAM:  VITAL SIGNS: Vitals:   03/17/21 0945  03/17/21 1000  BP: (!) 107/57 (!) 106/51  Pulse: 86 86  Resp: (!) 31 (!) 29  Temp:    SpO2: 96% 96%    Constitutional: Alert and oriented.  Appears uncomfortable.  Conversational in full sentences. Eyes: Conjunctivae are normal. PERRL. EOMI. Head: Atraumatic. Nose: No congestion/rhinnorhea. Mouth/Throat: Mucous membranes are dry.  Oropharynx non-erythematous. Neck: No stridor. No cervical spine tenderness to palpation. Cardiovascular: Normal rate, regular rhythm. Grossly normal heart sounds.  Good peripheral circulation. Respiratory: Tachypneic without retractions.  No wheezing.  Left-sided inspiratory rhonchi are present Gastrointestinal: Soft , nondistended, nontender to palpation. No CVA tenderness. Musculoskeletal: No lower extremity tenderness nor edema.  No joint effusions. No signs of acute trauma. Neurologic:  Normal speech and language. No gross focal neurologic deficits are appreciated.  Skin:  Skin is warm, dry and intact. No rash noted. Psychiatric: Mood and affect are normal. Speech and behavior are normal. ____________________________________________   LABS (all labs ordered are listed, but only abnormal results are displayed)  Labs Reviewed  CBC - Abnormal; Notable for the following components:      Result Value   WBC 12.7 (*)    All other components within normal limits  BASIC METABOLIC PANEL - Abnormal; Notable for the following components:   CO2 21 (*)    Glucose, Bld 129 (*)    BUN 32 (*)    Creatinine, Ser 1.64 (*)    GFR, Estimated 39 (*)    All other components within normal limits  RESP PANEL BY RT-PCR (FLU A&B, COVID) ARPGX2  CULTURE, BLOOD (ROUTINE X 2)  CULTURE, BLOOD (ROUTINE X 2)  LACTIC ACID, PLASMA   ____________________________________________  12 Lead EKG  Him, left axis, left bundle branch block and otherwise normal intervals.  No evidence of acute ischemia per Sgar[place]ossa ____________________________________________  RADIOLOGY  ED MD  interpretation: 2 view CXR reviewed by me with left lower lobe infiltration  Official radiology report(s): DG Chest 2 View  Result Date: 03/17/2021 CLINICAL DATA:  Shortness of breath.  Reported episode of aspiration EXAM: CHEST - 2 VIEW COMPARISON:  February 08, 2015 FINDINGS: There is patchy airspace opacity in the left mid and lower lung regions. There is mild atelectatic change in the right base. Lungs elsewhere clear. Heart is upper normal in size with pulmonary vascularity normal. There is aortic atherosclerosis. There is a loop recorder on the left. No bone lesions. IMPRESSION: Patchy airspace opacity left mid and lower lung regions. Question pneumonia or aspiration. Both entities may be present concurrently. Mild right base atelectasis. Heart upper normal in size. Loop recorder on left. Aortic Atherosclerosis (ICD10-I70.0). Electronically Signed   By: Bretta Bang III M.D.   On: 03/17/2021 09:46    ____________________________________________   PROCEDURES and INTERVENTIONS  Procedure(s) performed (including Critical Care):  .1-3 Lead EKG Interpretation Performed by: Delton Prairie, MD Authorized by: Delton Prairie, MD     Interpretation: normal     ECG rate:  84   ECG rate assessment: normal     Rhythm: sinus rhythm     Ectopy:  none     Conduction: normal      Medications  lactated ringers bolus 1,000 mL (1,000 mLs Intravenous New Bag/Given 03/17/21 1004)  Ampicillin-Sulbactam (UNASYN) 3 g in sodium chloride 0.9 % 100 mL IVPB (3 g Intravenous New Bag/Given 03/17/21 1053)    ____________________________________________   MDM / ED COURSE   Quite healthy 87 year old male presents from home after an aspiration event last night, with stigmata of aspiration pneumonia, requiring medical admission and antibiotics.  Hypoxic on room air to the mid 80s requiring 2 L nasal cannula, otherwise hemodynamically stable.  Exam with some mild tachypnea and left-sided rhonchi, no wheezing.  Benign  abdomen.  Blood work with mild leukocytosis and prerenal azotemia.  CXR with no infiltrates concerning for aspiration pneumonitis and pneumonia considering his subjective fevers, leukocytosis and cough.  Cultures were drawn and patient was provided Unasyn to treat this.  We will admit to hospitalist for further work-up and management     ____________________________________________   FINAL CLINICAL IMPRESSION(S) / ED DIAGNOSES  Final diagnoses:  Aspiration pneumonia of right lower lobe due to vomit (HCC)  Shortness of breath  Hypoxia     ED Discharge Orders    None       Rukiya Hodgkins   Note:  This document was prepared using Dragon voice recognition software and may include unintentional dictation errors.   Delton Prairie, MD 03/17/21 [phone]

## 2021-03-17 NOTE — ED Triage Notes (Addendum)
Pt comes with c/o SOB, cough and chills. Pt states this started all last night. Pt states body aches.  Pt has some labored breathing noted.   Current O2-88%. Pt placed on 2L [place] at this time.

## 2021-03-17 NOTE — ED Notes (Addendum)
Transport to [employer] unit requested per Diplomatic Services operational officer.

## 2021-03-18 DIAGNOSIS — J9601 Acute respiratory failure with hypoxia: Secondary | ICD-10-CM

## 2021-03-18 DIAGNOSIS — K219 Gastro-esophageal reflux disease without esophagitis: Secondary | ICD-10-CM | POA: Diagnosis not present

## 2021-03-18 DIAGNOSIS — J690 Pneumonitis due to inhalation of food and vomit: Secondary | ICD-10-CM | POA: Diagnosis not present

## 2021-03-18 LAB — CBC
HCT: 32 % — ABNORMAL LOW (ref 39.0–52.0)
Hemoglobin: 10.6 g/dL — ABNORMAL LOW (ref 13.0–17.0)
MCH: 31.3 pg (ref 26.0–34.0)
MCHC: 33.1 g/dL (ref 30.0–36.0)
MCV: 94.4 fL (ref 80.0–100.0)
Platelets: 169 10*3/uL (ref 150–400)
RBC: 3.39 MIL/uL — ABNORMAL LOW (ref 4.22–5.81)
RDW: 14.9 % (ref 11.5–15.5)
WBC: 13.6 10*3/uL — ABNORMAL HIGH (ref 4.0–10.5)
nRBC: 0 % (ref 0.0–0.2)

## 2021-03-18 LAB — BASIC METABOLIC PANEL
Anion gap: 4 — ABNORMAL LOW (ref 5–15)
BUN: 31 mg/dL — ABNORMAL HIGH (ref 8–23)
CO2: 25 mmol/L (ref 22–32)
Calcium: 8.1 mg/dL — ABNORMAL LOW (ref 8.9–10.3)
Chloride: 107 mmol/L (ref 98–111)
Creatinine, Ser: 1.61 mg/dL — ABNORMAL HIGH (ref 0.61–1.24)
GFR, Estimated: 40 mL/min — ABNORMAL LOW (ref >60)
Glucose, Bld: 113 mg/dL — ABNORMAL HIGH (ref 70–99)
Potassium: 4.1 mmol/L (ref 3.5–5.1)
Sodium: 136 mmol/L (ref 135–145)

## 2021-03-18 LAB — HIV ANTIBODY (ROUTINE TESTING W REFLEX): HIV Screen 4th Generation wRfx: NONREACTIVE

## 2021-03-18 MED ORDER — PANTOPRAZOLE SODIUM 40 MG PO TBEC: 40.0000 mg | DELAYED_RELEASE_TABLET | Freq: Every day | ORAL | 1 refills | Status: DC

## 2021-03-18 MED ORDER — AMOXICILLIN-POT CLAVULANATE 875-125 MG PO TABS: 1.0000 | ORAL_TABLET | Freq: Two times a day (BID) | ORAL | 0 refills | Status: AC

## 2021-03-18 MED ORDER — PANTOPRAZOLE SODIUM 40 MG PO TBEC: 40.0000 mg | DELAYED_RELEASE_TABLET | Freq: Two times a day (BID) | ORAL | Status: DC

## 2021-03-18 MED ORDER — AMOXICILLIN-POT CLAVULANATE 875-125 MG PO TABS
1.0000 | ORAL_TABLET | Freq: Two times a day (BID) | ORAL | Status: DC
  Administered 2021-03-18: 1 via ORAL
  Filled 2021-03-18: qty 1

## 2021-03-18 NOTE — Progress Notes (Signed)
Order received from Dr Patel to discontinue telemetry 

## 2021-03-18 NOTE — Discharge Summary (Addendum)
[facility] [place] [facility]   PATIENT NAME: Alexander Webster    MR#:  [id number]  DATE OF BIRTH:  01/08/1928  DATE OF ADMISSION:  03/17/2021 ADMITTING PHYSICIAN: Lucile Shuttersochukwu Agbata, MD  DATE OF DISCHARGE: 03/18/2021  PRIMARY CARE PHYSICIAN: Alan MulderMorayati, Shamil J, MD    ADMISSION DIAGNOSIS:  Shortness of breath [R06.02] Aspiration pneumonia (HCC) [J69.0] Hypoxia [R09.02] Aspiration pneumonia of right lower lobe due to vomit (HCC) [J69.0]  DISCHARGE DIAGNOSIS:  Aspiration Pneumonia GERD SECONDARY DIAGNOSIS:   Past Medical History:  Diagnosis Date  . Dysrhythmia    possible cause of syncope-   . Leg fracture   . Syncope     HOSPITAL COURSE:  Alexander Webster is a 87 y.o. male with medical history significant for syncope who presents to the ER for evaluation of shortness of breath as well as fever and chills.  Onset of symptoms was in the early hours of the morning of his admission.  Patient states that he had a heavy meal for dinner and then ate a whole box of ice cream.  He went to bed and woke up in the early hours of the morning with multiple episodes of nonbloody, nonbilious emesis.  Acute hypoxic respiratory failure secondary to aspiration pneumonia --Patient presents to the ER for evaluation of shortness of breath, cough associated with fever and chills. --He had multiple episodes of emesis overnight and thinks he may have choked while he was throwing up --Imaging is suggestive of possible aspiration pneumonia -- on IV Unasyn--changed to po Augmentin --Appreciate  speech therapy recommnedations --Patient had room air pulse oximetry of 88% upon arrival to the ER is currently on 2 L of oxygen-- currently on room air. Patient ambulated along with me in the hallways maintain sats greater than 90%.  Gerd/acid reflux -- started on Protonix 40 mg daily. -- PCP to refer patient to G.I. for further evaluation management.  DVT prophylaxis: Lovenox Code Status: full  code Family Communication:  patient tells me  his family is updated   CODE STATUS  full code Disposition Plan: Back to previous home environment Consults called: none   Patient improved remarkably. He will be discharged to home and recommended to complete antibiotic. Patient is recommended to follow-up with PCP and get G.I. referral for further evaluation of Gerd.    CONSULTS OBTAINED:    DRUG ALLERGIES:   Allergies  Allergen Reactions  . Percocet [Oxycodone-Acetaminophen] Itching    Itching  . Vicodin [Hydrocodone-Acetaminophen] Itching    DISCHARGE MEDICATIONS:   Allergies as of 03/18/2021      Reactions   Percocet [oxycodone-acetaminophen] Itching   Itching   Vicodin [hydrocodone-acetaminophen] Itching      Medication List    STOP taking these medications   BENGAY EX   OMEGA-3 COMPLEX PO     TAKE these medications   ADV TURMERIC CURCUMIN COMPLEX PO Take 1 capsule by mouth in the morning and at bedtime. Primal Force Curcumin Triple Burn   amoxicillin-clavulanate 875-125 MG tablet Commonly known as: AUGMENTIN Take 1 tablet by mouth every 12 (twelve) hours for 6 days.   clopidogrel 75 MG tablet Commonly known as: PLAVIX Take 75 mg by mouth at bedtime.   metoprolol succinate 25 MG 24 hr tablet Commonly known as: TOPROL-XL Take 1 tablet by mouth daily.   montelukast 10 MG tablet Commonly known as: SINGULAIR Take 10 mg by mouth daily.   pantoprazole 40 MG tablet Commonly known as: PROTONIX Take 1 tablet (40 mg  total) by mouth daily.   simvastatin 20 MG tablet Commonly known as: ZOCOR Take 20 mg by mouth at bedtime.       If you experience worsening of your admission symptoms, develop shortness of breath, life threatening emergency, suicidal or homicidal thoughts you must seek medical attention immediately by calling 911 or calling your MD immediately  if symptoms less severe.  You Must read complete instructions/literature along with all the  possible adverse reactions/side effects for all the Medicines you take and that have been prescribed to you. Take any new Medicines after you have completely understood and accept all the possible adverse reactions/side effects.   Please note  You were cared for by a hospitalist during your hospital stay. If you have any questions about your discharge medications or the care you received while you were in the hospital after you are discharged, you can call the unit and asked to speak with the hospitalist on call if the hospitalist that took care of you is not available. Once you are discharged, your primary care physician will handle any further medical issues. Please note that NO REFILLS for any discharge medications will be authorized once you are discharged, as it is imperative that you return to your primary care physician (or establish a relationship with a primary care physician if you do not have one) for your aftercare needs so that they can reassess your need for medications and monitor your lab values. Today   SUBJECTIVE    I feel a whole lot better. No vomiting. No difficulty breathing. Ate breakfast. VITAL SIGNS:  Blood pressure (!) 143/70, pulse 72, temperature (!) 97.5 F (36.4 C), temperature source Oral, resp. rate 20, height 6\' 5"  (1.956 m), weight 81.6 kg, SpO2 95 %.  I/O:    Intake/Output Summary (Last 24 hours) at 03/18/2021 1016 Last data filed at 03/18/2021 1012 Gross per 24 hour  Intake 600.07 ml  Output --  Net 600.07 ml    PHYSICAL EXAMINATION:  GENERAL:  87 y.o.-year-old patient lying in the bed with no acute distress.  LUNGS: Normal breath sounds bilaterally, no wheezing, rales,rhonchi or crepitation. No use of accessory muscles of respiration.  CARDIOVASCULAR: S1, S2 normal. No murmurs, rubs, or gallops.  ABDOMEN: Soft, non-tender, non-distended. Bowel sounds present. No organomegaly or mass.  EXTREMITIES: No pedal edema, cyanosis, or clubbing.  NEUROLOGIC:  Cranial nerves II through XII are intact. Muscle strength 5/5 in all extremities. Sensation intact. Gait not checked.  PSYCHIATRIC: The patient is alert and oriented x 3.  SKIN: No obvious rash, lesion, or ulcer.   DATA REVIEW:   CBC  Recent Labs  Lab 03/18/21 0429  WBC 13.6*  HGB 10.6*  HCT 32.0*  PLT 169    Chemistries  Recent Labs  Lab 03/18/21 0429  NA 136  K 4.1  CL 107  CO2 25  GLUCOSE 113*  BUN 31*  CREATININE 1.61*  CALCIUM 8.1*    Microbiology Results   Recent Results (from the past 240 hour(s))  Blood culture (routine x 2)     Status: None (Preliminary result)   Collection Time: 03/17/21  9:54 AM   Specimen: BLOOD  Result Value Ref Range Status   Specimen Description BLOOD RIGHT ARM  Final   Special Requests   Final    BOTTLES DRAWN AEROBIC AND ANAEROBIC Blood Culture results may not be optimal due to an excessive volume of blood received in culture bottles   Culture   Final  NO GROWTH < 24 HOURS Performed at [facility], [address] [place], [place] [address]    Report Status PENDING  Incomplete  Blood culture (routine x 2)     Status: None (Preliminary result)   Collection Time: 03/17/21  9:55 AM   Specimen: BLOOD  Result Value Ref Range Status   Specimen Description BLOOD LAC  Final   Special Requests   Final    BOTTLES DRAWN AEROBIC AND ANAEROBIC Blood Culture adequate volume   Culture   Final    NO GROWTH < 24 HOURS Performed at [facility], [address] [place], [place] [address]    Report Status PENDING  Incomplete  Resp Panel by RT-PCR (Flu A&B, Covid) Nasopharyngeal Swab     Status: None   Collection Time: 03/17/21  9:55 AM   Specimen: Nasopharyngeal Swab; Nasopharyngeal(NP) swabs in vial transport medium  Result Value Ref Range Status   SARS Coronavirus 2 by RT PCR NEGATIVE NEGATIVE Final    Comment: (NOTE) SARS-CoV-2 target nucleic acids are NOT DETECTED.  The SARS-CoV-2 RNA is generally detectable in  upper respiratory specimens during the acute phase of infection. The lowest concentration of SARS-CoV-2 viral copies this assay can detect is 138 copies/mL. A negative result does not preclude SARS-Cov-2 infection and should not be used as the sole basis for treatment or other patient management decisions. A negative result may occur with  improper specimen collection/handling, submission of specimen other than nasopharyngeal swab, presence of viral mutation(s) within the areas targeted by this assay, and inadequate number of viral copies(<138 copies/mL). A negative result must be combined with clinical observations, patient history, and epidemiological information. The expected result is Negative.  Fact Sheet for Patients:  BloggerCourse.com  Fact Sheet for Healthcare Providers:  SeriousBroker.it  This test is no t yet approved or cleared by the [place] FDA and  has been authorized for detection and/or diagnosis of SARS-CoV-2 by FDA under an Emergency Use Authorization (EUA). This EUA will remain  in effect (meaning this test can be used) for the duration of the COVID-19 declaration under Section 564(b)(1) of the Act, 21 U.S.C.section 360bbb-3(b)(1), unless the authorization is terminated  or revoked sooner.       Influenza A by PCR NEGATIVE NEGATIVE Final   Influenza B by PCR NEGATIVE NEGATIVE Final    Comment: (NOTE) The Xpert Xpress SARS-CoV-2/FLU/RSV plus assay is intended as an aid in the diagnosis of influenza from Nasopharyngeal swab specimens and should not be used as a sole basis for treatment. Nasal washings and aspirates are unacceptable for Xpert Xpress SARS-CoV-2/FLU/RSV testing.  Fact Sheet for Patients: BloggerCourse.com  Fact Sheet for Healthcare Providers: SeriousBroker.it  This test is not yet approved or cleared by the [place] FDA and has been  authorized for detection and/or diagnosis of SARS-CoV-2 by FDA under an Emergency Use Authorization (EUA). This EUA will remain in effect (meaning this test can be used) for the duration of the COVID-19 declaration under Section 564(b)(1) of the Act, 21 U.S.C. section 360bbb-3(b)(1), unless the authorization is terminated or revoked.  Performed at [facility], [address] [place], [place] [address]     RADIOLOGY:  DG Chest 2 View  Result Date: 03/17/2021 CLINICAL DATA:  Shortness of breath.  Reported episode of aspiration EXAM: CHEST - 2 VIEW COMPARISON:  February 08, 2015 FINDINGS: There is patchy airspace opacity in the left mid and lower lung regions. There is mild atelectatic change in the right  base. Lungs elsewhere clear. Heart is upper normal in size with pulmonary vascularity normal. There is aortic atherosclerosis. There is a loop recorder on the left. No bone lesions. IMPRESSION: Patchy airspace opacity left mid and lower lung regions. Question pneumonia or aspiration. Both entities may be present concurrently. Mild right base atelectasis. Heart upper normal in size. Loop recorder on left. Aortic Atherosclerosis (ICD10-I70.0). Electronically Signed   By: Bretta Bang III M.D.   On: 03/17/2021 09:46     CODE STATUS:     Code Status Orders  (From admission, onward)         Start     Ordered   03/17/21 1223  Full code  Continuous        03/17/21 1223        Code Status History    This patient has a current code status but no historical code status.   Advance Care Planning Activity       TOTAL TIME TAKING CARE OF THIS PATIENT: *40* minutes.    Enedina Finner M.D  [facility]    CC: Primary care physician; Alan Mulder, MD

## 2021-03-18 NOTE — Care Management CC44 (Signed)
Condition Code 44 Documentation Completed  Patient Details  Name: Alexander Webster MRN: [id number] Date of Birth: 1928-05-02   Condition Code 44 given:  Yes Patient signature on Condition Code 44 notice:  Yes Documentation of 2 MD's agreement:  Yes Code 44 added to claim:  Yes    Gildardo Griffes, LCSW 03/18/2021, 10:54 AM

## 2021-03-18 NOTE — Discharge Instructions (Signed)
PEr Speech therapy: General Regular diet w/ cut meats/foods; moistened. Thin liquids w/ less straw use d/t air swallowed. STRICT REFLUX/GERD PRECAUTIONS. General aspiration precautions.  Medication Administration: Whole meds with puree (for easier swallowing)

## 2021-03-18 NOTE — Care Management Obs Status (Signed)
MEDICARE OBSERVATION STATUS NOTIFICATION   Patient Details  Name: Alexander Webster MRN: [id number] Date of Birth: 01/17/1928   Medicare Observation Status Notification Given:  Yes    Gildardo Griffes, LCSW 03/18/2021, 10:54 AM

## 2021-03-22 LAB — CULTURE, BLOOD (ROUTINE X 2)
Culture: NO GROWTH
Culture: NO GROWTH
Special Requests: ADEQUATE

## 2021-06-30 ENCOUNTER — Other Ambulatory Visit: Payer: Self-pay

## 2021-06-30 ENCOUNTER — Emergency Department
Admission: EM | Admit: 2021-06-30 | Discharge: 2021-06-30 | Disposition: A | Discharge status: Home / Self Care | Payer: Medicare Other | Attending: Emergency Medicine | Admitting: Emergency Medicine

## 2021-06-30 ENCOUNTER — Emergency Department: Payer: Medicare Other

## 2021-06-30 DIAGNOSIS — S0990XA Unspecified injury of head, initial encounter: Secondary | ICD-10-CM | POA: Diagnosis present

## 2021-06-30 DIAGNOSIS — S51811A Laceration without foreign body of right forearm, initial encounter: Secondary | ICD-10-CM | POA: Diagnosis not present

## 2021-06-30 DIAGNOSIS — W01198A Fall on same level from slipping, tripping and stumbling with subsequent striking against other object, initial encounter: Secondary | ICD-10-CM | POA: Insufficient documentation

## 2021-06-30 DIAGNOSIS — Z87891 Personal history of nicotine dependence: Secondary | ICD-10-CM | POA: Diagnosis not present

## 2021-06-30 DIAGNOSIS — Z7902 Long term (current) use of antithrombotics/antiplatelets: Secondary | ICD-10-CM | POA: Diagnosis not present

## 2021-06-30 DIAGNOSIS — W19XXXA Unspecified fall, initial encounter: Secondary | ICD-10-CM

## 2021-06-30 DIAGNOSIS — S41111A Laceration without foreign body of right upper arm, initial encounter: Secondary | ICD-10-CM

## 2021-06-30 DIAGNOSIS — S0181XA Laceration without foreign body of other part of head, initial encounter: Secondary | ICD-10-CM | POA: Diagnosis not present

## 2021-06-30 DIAGNOSIS — S61213A Laceration without foreign body of left middle finger without damage to nail, initial encounter: Secondary | ICD-10-CM | POA: Insufficient documentation

## 2021-06-30 NOTE — Discharge Instructions (Addendum)
Your exam and CT scans & XRs are normal after your fall. There is no evidence of a serious injury or trauma to the brain, face, or neck. Your wounds have been covered with wound adhesive. Take OTC Tylenol as needed for pain. Follow-up with your provider as needed.

## 2021-06-30 NOTE — ED Provider Notes (Signed)
[facility] Emergency Department Provider Note   ____________________________________________   Event Date/Time   First MD Initiated Contact with Patient 06/30/21 1424     (approximate)  I have reviewed the triage vital signs and the nursing notes.   HISTORY  Chief Complaint Fall   HPI Alexander Webster is a 87 y.o. male with the below medical history, presents to the ED for evaluation following a mechanical fall.  Patient presents via EMS from [facility], for fall on the concrete, resulting in the head injury.  Patient was apparently pushing his wife, who was sitting in a wheeled walker, out towards the curb, when they both fell.  Patient presents with a laceration to the right temple.  He also complains of some right wrist, shoulder and left hand pain.  He denies any loss of consciousness.  He also denies any blood thinner use, but Plavix is still a current prescription noted on his med list.  Past Medical History:  Diagnosis Date   Dysrhythmia    possible cause of syncope-    Leg fracture    Syncope     Patient Active Problem List   Diagnosis Date Noted   Gastroesophageal reflux disease    Aspiration pneumonia (HCC) 03/17/2021   Acute respiratory failure (HCC) 03/17/2021   Cough 08/28/2014   Vasomotor rhinitis 08/28/2014    Past Surgical History:  Procedure Laterality Date   HERNIA REPAIR Bilateral 05-24-12   Dr Evette Cristal   LEG SURGERY Right 02/08/2015   LOOP RECORDER INSERTION N/A 10/30/2020   Procedure: LOOP RECORDER INSERTION;  Surgeon: Marcina Millard, MD;  Location: [facility] INVASIVE CV LAB;  Service: Cardiovascular;  Laterality: N/A;   MUSCLE REPAIR Right    calf   ruptured disc     ruptured disc repair    Prior to Admission medications   Medication Sig Start Date End Date Taking? Authorizing Provider  clopidogrel (PLAVIX) 75 MG tablet Take 75 mg by mouth at bedtime. 08/15/20   [provider]  metoprolol succinate  (TOPROL-XL) 25 MG 24 hr tablet Take 1 tablet by mouth daily. 02/04/21   [provider]  Misc Natural Products (ADV TURMERIC CURCUMIN COMPLEX PO) Take 1 capsule by mouth in the morning and at bedtime. Primal Force Curcumin Triple Burn    [provider]  montelukast (SINGULAIR) 10 MG tablet Take 10 mg by mouth daily. 08/15/20   [provider]  pantoprazole (PROTONIX) 40 MG tablet Take 1 tablet (40 mg total) by mouth daily. 03/18/21   Enedina Finner, MD  simvastatin (ZOCOR) 20 MG tablet Take 20 mg by mouth at bedtime. 09/14/20   [provider]    Allergies Percocet [oxycodone-acetaminophen] and Vicodin [hydrocodone-acetaminophen]  Family History  Problem Relation Age of Onset   Heart disease Mother    Cancer Mother        lymphoma   Cancer Father        prostate    Social History Social History   Tobacco Use   Smoking status: Former    Packs/day: 1.00    Years: 20.00    Pack years: 20.00    Types: Cigarettes    Quit date: 08/28/1994    Years since quitting: 26.8   Smokeless tobacco: Never   Tobacco comments:    Smoked cigarettes, pipe.   Vaping Use   Vaping Use: Never used  Substance Use Topics   Alcohol use: No    Alcohol/week: 0.0 standard drinks   Drug  use: No    Review of Systems  Constitutional: No fever/chills Eyes: No visual changes. ENT: No sore throat. Cardiovascular: Denies chest pain. Respiratory: Denies shortness of breath. Gastrointestinal: No abdominal pain.  No nausea, no vomiting.  No diarrhea.  No constipation. Genitourinary: Negative for dysuria. Musculoskeletal: Negative for back pain. Skin: Negative for rash.  Multiple lacerations noted. Neurological: Negative for headaches, focal weakness or numbness. ____________________________________________   PHYSICAL EXAM:  VITAL SIGNS: ED Triage Vitals  Enc Vitals Group     BP 06/30/21 1411 (!) 144/84     Pulse Rate 06/30/21 1411 79     Resp 06/30/21 1411 18      Temp 06/30/21 1411 98.2 F (36.8 C)     Temp Source 06/30/21 1411 Oral     SpO2 06/30/21 1411 97 %     Weight 06/30/21 1412 170 lb (77.1 kg)     Height 06/30/21 1412 5\' 7"  (1.702 m)     Head Circumference --      Peak Flow --      Pain Score 06/30/21 1417 5     Pain Loc --      Pain Edu? --      Excl. in GC? --     Constitutional: Alert and oriented. Well appearing and in no acute distress. GCS = 15 Eyes: Conjunctivae are normal. PERRL. EOMI. Head: Atraumatic, except for 1 cm laceration to the right temple. Nose: No congestion/rhinnorhea. Mouth/Throat: Mucous membranes are moist.  Oropharynx non-erythematous. Neck: No stridor. No cervical spine tenderness to palpation. Cardiovascular: Normal rate, regular rhythm. Grossly normal heart sounds.  Good peripheral circulation. Respiratory: Normal respiratory effort.  No retractions. Lungs CTAB. Gastrointestinal: Soft and nontender. No distention. No abdominal bruits. No CVA tenderness. Musculoskeletal: No lower extremity tenderness nor edema.  No joint effusions. Neurologic:  Normal speech and language. No gross focal neurologic deficits are appreciated. No gait instability. Skin:  Skin is warm, dry and intact. No rash noted.  Patient with a 1 cm skin tear to the lateral right elbow, and a laceration across the middle knuckle of the left long finger. Psychiatric: Mood and affect are normal. Speech and behavior are normal.  ____________________________________________   LABS (all labs ordered are listed, but only abnormal results are displayed)  Labs Reviewed - No data to display ____________________________________________  EKG   ____________________________________________  RADIOLOGY I, 07/02/21, personally viewed and evaluated these images (plain radiographs) as part of my medical decision making, as well as reviewing the written report by the radiologist.  ED MD interpretation:  agree with report  Official  radiology report(s): DG Shoulder Right  Result Date: 06/30/2021 CLINICAL DATA:  Right shoulder pain after fall today. EXAM: RIGHT SHOULDER - 2+ VIEW COMPARISON:  None. FINDINGS: There is no evidence of fracture or dislocation. Moderate narrowing of right glenohumeral joint is noted. Soft tissues are unremarkable. IMPRESSION: Moderate degenerative joint disease of right glenohumeral joint. No acute abnormality is noted. Electronically Signed   By: 07/02/2021 M.D.   On: 06/30/2021 15:01   CT HEAD WO CONTRAST (07/02/2021)  Result Date: 06/30/2021 CLINICAL DATA:  Facial trauma.  Fall striking head on concrete. Facial trauma EXAM: CT HEAD WITHOUT CONTRAST CT MAXILLOFACIAL WITHOUT CONTRAST CT CERVICAL SPINE WITHOUT CONTRAST TECHNIQUE: Multidetector CT imaging of the head, cervical spine, and maxillofacial structures were performed using the standard protocol without intravenous contrast. Multiplanar CT image reconstructions of the cervical spine and maxillofacial structures were also generated. COMPARISON:  None. FINDINGS:  CT HEAD FINDINGS Brain: Age related atrophy and chronic small vessel ischemia. No intracranial hemorrhage, mass effect, or midline shift. No hydrocephalus. The basilar cisterns are patent. No evidence of territorial infarct or acute ischemia. No extra-axial or intracranial fluid collection. Vascular: Atherosclerosis of skullbase vasculature without hyperdense vessel or abnormal calcification. Skull: No fracture or focal lesion. Other: Paranasal sinuses and mastoid air cells are clear. The visualized orbits are unremarkable. Bilateral cataract resection CT MAXILLOFACIAL FINDINGS Osseous: No acute fracture of the nasal bone, zygomatic arches, and mandibles. The temporomandibular joints are congruent. Nasal septum is midline. Orbits: No orbital fracture or globe injury. Bilateral cataract resection. No orbital inflammation. Sinuses: No sinus fracture or fluid level. Paranasal sinuses are clear. No  mastoid effusion. Soft tissues: Mild right face soft tissue edema. No confluent hematoma. CT CERVICAL SPINE FINDINGS Alignment: No traumatic malalignment. There is grade 1 anterolisthesis of C4 on C5 that is likely facet mediated. Skull base and vertebrae: No acute fracture. Vertebral body heights are maintained. The dens and skull base are intact. There is degenerative pannus at C1-C2. Soft tissues and spinal canal: No prevertebral fluid or swelling. No visible canal hematoma. Disc levels: Degenerative disc disease at C5-C6 and C6-C7 with disc space narrowing and spurring. There is multilevel facet hypertrophy. No high-grade bony canal stenosis. Upper chest: No acute or unexpected findings, mildly motion obscured. Other: None. IMPRESSION: 1. No acute intracranial abnormality. No skull fracture. Age related atrophy and chronic small vessel ischemia. 2. No facial bone fracture. Mild right face soft tissue edema. 3. Degenerative change in the cervical spine without acute fracture or subluxation. Electronically Signed   By: Narda Rutherford M.D.   On: 06/30/2021 15:25   CT Cervical Spine Wo Contrast  Result Date: 06/30/2021 CLINICAL DATA:  Facial trauma.  Fall striking head on concrete. Facial trauma EXAM: CT HEAD WITHOUT CONTRAST CT MAXILLOFACIAL WITHOUT CONTRAST CT CERVICAL SPINE WITHOUT CONTRAST TECHNIQUE: Multidetector CT imaging of the head, cervical spine, and maxillofacial structures were performed using the standard protocol without intravenous contrast. Multiplanar CT image reconstructions of the cervical spine and maxillofacial structures were also generated. COMPARISON:  None. FINDINGS: CT HEAD FINDINGS Brain: Age related atrophy and chronic small vessel ischemia. No intracranial hemorrhage, mass effect, or midline shift. No hydrocephalus. The basilar cisterns are patent. No evidence of territorial infarct or acute ischemia. No extra-axial or intracranial fluid collection. Vascular: Atherosclerosis of  skullbase vasculature without hyperdense vessel or abnormal calcification. Skull: No fracture or focal lesion. Other: Paranasal sinuses and mastoid air cells are clear. The visualized orbits are unremarkable. Bilateral cataract resection CT MAXILLOFACIAL FINDINGS Osseous: No acute fracture of the nasal bone, zygomatic arches, and mandibles. The temporomandibular joints are congruent. Nasal septum is midline. Orbits: No orbital fracture or globe injury. Bilateral cataract resection. No orbital inflammation. Sinuses: No sinus fracture or fluid level. Paranasal sinuses are clear. No mastoid effusion. Soft tissues: Mild right face soft tissue edema. No confluent hematoma. CT CERVICAL SPINE FINDINGS Alignment: No traumatic malalignment. There is grade 1 anterolisthesis of C4 on C5 that is likely facet mediated. Skull base and vertebrae: No acute fracture. Vertebral body heights are maintained. The dens and skull base are intact. There is degenerative pannus at C1-C2. Soft tissues and spinal canal: No prevertebral fluid or swelling. No visible canal hematoma. Disc levels: Degenerative disc disease at C5-C6 and C6-C7 with disc space narrowing and spurring. There is multilevel facet hypertrophy. No high-grade bony canal stenosis. Upper chest: No acute or unexpected findings, mildly motion  obscured. Other: None. IMPRESSION: 1. No acute intracranial abnormality. No skull fracture. Age related atrophy and chronic small vessel ischemia. 2. No facial bone fracture. Mild right face soft tissue edema. 3. Degenerative change in the cervical spine without acute fracture or subluxation. Electronically Signed   By: Narda Rutherford M.D.   On: 06/30/2021 15:25   CT MAXILLOFACIAL WO CONTRAST  Result Date: 06/30/2021 CLINICAL DATA:  Facial trauma.  Fall striking head on concrete. Facial trauma EXAM: CT HEAD WITHOUT CONTRAST CT MAXILLOFACIAL WITHOUT CONTRAST CT CERVICAL SPINE WITHOUT CONTRAST TECHNIQUE: Multidetector CT imaging of the  head, cervical spine, and maxillofacial structures were performed using the standard protocol without intravenous contrast. Multiplanar CT image reconstructions of the cervical spine and maxillofacial structures were also generated. COMPARISON:  None. FINDINGS: CT HEAD FINDINGS Brain: Age related atrophy and chronic small vessel ischemia. No intracranial hemorrhage, mass effect, or midline shift. No hydrocephalus. The basilar cisterns are patent. No evidence of territorial infarct or acute ischemia. No extra-axial or intracranial fluid collection. Vascular: Atherosclerosis of skullbase vasculature without hyperdense vessel or abnormal calcification. Skull: No fracture or focal lesion. Other: Paranasal sinuses and mastoid air cells are clear. The visualized orbits are unremarkable. Bilateral cataract resection CT MAXILLOFACIAL FINDINGS Osseous: No acute fracture of the nasal bone, zygomatic arches, and mandibles. The temporomandibular joints are congruent. Nasal septum is midline. Orbits: No orbital fracture or globe injury. Bilateral cataract resection. No orbital inflammation. Sinuses: No sinus fracture or fluid level. Paranasal sinuses are clear. No mastoid effusion. Soft tissues: Mild right face soft tissue edema. No confluent hematoma. CT CERVICAL SPINE FINDINGS Alignment: No traumatic malalignment. There is grade 1 anterolisthesis of C4 on C5 that is likely facet mediated. Skull base and vertebrae: No acute fracture. Vertebral body heights are maintained. The dens and skull base are intact. There is degenerative pannus at C1-C2. Soft tissues and spinal canal: No prevertebral fluid or swelling. No visible canal hematoma. Disc levels: Degenerative disc disease at C5-C6 and C6-C7 with disc space narrowing and spurring. There is multilevel facet hypertrophy. No high-grade bony canal stenosis. Upper chest: No acute or unexpected findings, mildly motion obscured. Other: None. IMPRESSION: 1. No acute intracranial  abnormality. No skull fracture. Age related atrophy and chronic small vessel ischemia. 2. No facial bone fracture. Mild right face soft tissue edema. 3. Degenerative change in the cervical spine without acute fracture or subluxation. Electronically Signed   By: Narda Rutherford M.D.   On: 06/30/2021 15:25    ____________________________________________   PROCEDURES  Procedure(s) performed (including Critical Care):  Marland KitchenMarland KitchenLaceration Repair  Date/Time: 06/30/2021 3:55 PM Performed by: Lissa Hoard, PA-C Authorized by: Lissa Hoard, PA-C   Consent:    Consent obtained:  Verbal   Consent given by:  Patient   Risks, benefits, and alternatives were discussed: yes     Risks discussed:  Pain and poor wound healing   Alternatives discussed:  No treatment Universal protocol:    Patient identity confirmed:  Verbally with patient Anesthesia:    Anesthesia method:  None Laceration details:    Location:  Face (& right forearm & left middle finger)   Length (cm):  1 (& 1 cm & 0.5 cm)   Depth (mm):  4 Pre-procedure details:    Preparation:  Patient was prepped and draped in usual sterile fashion Exploration:    Limited defect created (wound extended): no     Hemostasis achieved with:  Direct pressure   Contaminated: no   Treatment:  Area cleansed with:  Saline   Amount of cleaning:  Standard   Irrigation volume:  20 ml   Irrigation method:  Syringe   Debridement:  None   Scar revision: no   Skin repair:    Repair method:  Tissue adhesive Approximation:    Approximation:  Close Repair type:    Repair type:  Simple Post-procedure details:    Dressing:  Open (no dressing)   Procedure completion:  Tolerated well, no immediate complications   ____________________________________________   INITIAL IMPRESSION / ASSESSMENT AND PLAN / ED COURSE  As part of my medical decision making, I reviewed the following data within the electronic MEDICAL RECORD NUMBER Radiograph  reviewed WNL and Notes from prior ED visits  DD[facility] facial [facility]   Geriatric patient with ED evaluation of injury sustained following mechanical fall.  Patient presents to the ED with multiple lacerations including the right temple, right forearm, and left finger.  He also endorsed some shoulder pain.  He was imaged with CTs of the head, face, neck.  Felt his pain from the shoulder.  All subsequent imaging is negative and reassuring at this time.  No signs of any acute intracranial process or any occult fractures.  Patient will be discharged in stable condition and his wounds of been repaired using Dermabond.  He will follow-up with primary provider return to the ED if needed. ____________________________________________   FINAL CLINICAL IMPRESSION(S) / ED DIAGNOSES  Final diagnoses:  Fall, initial encounter  Facial laceration, initial encounter  Laceration of right upper extremity, initial encounter  Minor head injury, initial encounter     ED Discharge Orders     None        Note:  This document was prepared using Dragon voice recognition software and may include unintentional dictation errors.    Lissa HoardMenshew, Dayln Tugwell V Bacon, PA-C 06/30/21 1613    Delton PrairieSmith, Dylan, MD 06/30/21 1626

## 2021-06-30 NOTE — ED Notes (Signed)
See triage note  Presents s/p fall  States he fell hitting head on concrete  Having pain to right shoulder/wrist  Left hand and head  Small laceration to face near right eye

## 2021-06-30 NOTE — ED Triage Notes (Signed)
Pt to ED [facility] from [facility] for fall to concrete, hitting head. No LOC. Laceration noted beside right eye, right wrist, left hand.  C/o head pain and right shoulder pain No blood thinner use

## 2021-07-06 ENCOUNTER — Encounter
Admission: EM | Disposition: A | Discharge status: Home / Self Care | Payer: Self-pay | Source: Home / Self Care | Attending: Internal Medicine

## 2021-07-06 ENCOUNTER — Inpatient Hospital Stay
Admission: EM | Admit: 2021-07-06 | Discharge: 2021-07-08 | DRG: 229 | Disposition: A | Discharge status: Home / Self Care | Payer: Medicare Other | Attending: Internal Medicine | Admitting: Internal Medicine

## 2021-07-06 ENCOUNTER — Other Ambulatory Visit: Payer: Self-pay

## 2021-07-06 ENCOUNTER — Encounter: Payer: Self-pay | Admitting: Internal Medicine

## 2021-07-06 ENCOUNTER — Inpatient Hospital Stay: Payer: Medicare Other

## 2021-07-06 ENCOUNTER — Inpatient Hospital Stay
Admit: 2021-07-06 | Discharge: 2021-07-06 | Disposition: A | Discharge status: Home / Self Care | Payer: Medicare Other | Attending: Internal Medicine | Admitting: Internal Medicine

## 2021-07-06 ENCOUNTER — Emergency Department: Payer: Medicare Other

## 2021-07-06 DIAGNOSIS — J309 Allergic rhinitis, unspecified: Secondary | ICD-10-CM | POA: Diagnosis present

## 2021-07-06 DIAGNOSIS — K219 Gastro-esophageal reflux disease without esophagitis: Secondary | ICD-10-CM | POA: Diagnosis present

## 2021-07-06 DIAGNOSIS — J449 Chronic obstructive pulmonary disease, unspecified: Secondary | ICD-10-CM | POA: Diagnosis present

## 2021-07-06 DIAGNOSIS — Z79899 Other long term (current) drug therapy: Secondary | ICD-10-CM | POA: Diagnosis not present

## 2021-07-06 DIAGNOSIS — I2510 Atherosclerotic heart disease of native coronary artery without angina pectoris: Secondary | ICD-10-CM | POA: Diagnosis present

## 2021-07-06 DIAGNOSIS — Z7951 Long term (current) use of inhaled steroids: Secondary | ICD-10-CM | POA: Diagnosis not present

## 2021-07-06 DIAGNOSIS — Z8701 Personal history of pneumonia (recurrent): Secondary | ICD-10-CM | POA: Diagnosis not present

## 2021-07-06 DIAGNOSIS — R001 Bradycardia, unspecified: Secondary | ICD-10-CM | POA: Diagnosis present

## 2021-07-06 DIAGNOSIS — Z87891 Personal history of nicotine dependence: Secondary | ICD-10-CM | POA: Diagnosis not present

## 2021-07-06 DIAGNOSIS — N1832 Chronic kidney disease, stage 3b: Secondary | ICD-10-CM | POA: Diagnosis not present

## 2021-07-06 DIAGNOSIS — Z8249 Family history of ischemic heart disease and other diseases of the circulatory system: Secondary | ICD-10-CM | POA: Diagnosis not present

## 2021-07-06 DIAGNOSIS — N1831 Chronic kidney disease, stage 3a: Secondary | ICD-10-CM | POA: Diagnosis present

## 2021-07-06 DIAGNOSIS — I2583 Coronary atherosclerosis due to lipid rich plaque: Secondary | ICD-10-CM

## 2021-07-06 DIAGNOSIS — I442 Atrioventricular block, complete: Secondary | ICD-10-CM | POA: Diagnosis present

## 2021-07-06 DIAGNOSIS — Z95818 Presence of other cardiac implants and grafts: Secondary | ICD-10-CM | POA: Diagnosis not present

## 2021-07-06 DIAGNOSIS — Z006 Encounter for examination for normal comparison and control in clinical research program: Secondary | ICD-10-CM | POA: Diagnosis not present

## 2021-07-06 DIAGNOSIS — Z20822 Contact with and (suspected) exposure to covid-19: Secondary | ICD-10-CM | POA: Diagnosis present

## 2021-07-06 DIAGNOSIS — Z7902 Long term (current) use of antithrombotics/antiplatelets: Secondary | ICD-10-CM | POA: Diagnosis not present

## 2021-07-06 DIAGNOSIS — R570 Cardiogenic shock: Secondary | ICD-10-CM

## 2021-07-06 DIAGNOSIS — Z885 Allergy status to narcotic agent status: Secondary | ICD-10-CM | POA: Diagnosis not present

## 2021-07-06 HISTORY — PX: TEMPORARY PACEMAKER: CATH118268

## 2021-07-06 LAB — CBC WITH DIFFERENTIAL/PLATELET
Abs Immature Granulocytes: 0.03 10*3/uL (ref 0.00–0.07)
Basophils Absolute: 0.1 10*3/uL (ref 0.0–0.1)
Basophils Relative: 1 %
Eosinophils Absolute: 0.2 10*3/uL (ref 0.0–0.5)
Eosinophils Relative: 3 %
HCT: 37.9 % — ABNORMAL LOW (ref 39.0–52.0)
Hemoglobin: 12.5 g/dL — ABNORMAL LOW (ref 13.0–17.0)
Immature Granulocytes: 0 %
Lymphocytes Relative: 24 %
Lymphs Abs: 1.7 10*3/uL (ref 0.7–4.0)
MCH: 32.5 pg (ref 26.0–34.0)
MCHC: 33 g/dL (ref 30.0–36.0)
MCV: 98.4 fL (ref 80.0–100.0)
Monocytes Absolute: 0.9 10*3/uL (ref 0.1–1.0)
Monocytes Relative: 12 %
Neutro Abs: 4.2 10*3/uL (ref 1.7–7.7)
Neutrophils Relative %: 60 %
Platelets: 214 10*3/uL (ref 150–400)
RBC: 3.85 MIL/uL — ABNORMAL LOW (ref 4.22–5.81)
RDW: 14.2 % (ref 11.5–15.5)
WBC: 7.1 10*3/uL (ref 4.0–10.5)
nRBC: 0 % (ref 0.0–0.2)

## 2021-07-06 LAB — BLOOD GAS, ARTERIAL
Acid-base deficit: 1.8 mmol/L (ref 0.0–2.0)
Bicarbonate: 21.9 mmol/L (ref 20.0–28.0)
FIO2: 0.28
MECHVT: 500 mL
O2 Saturation: 96.2 %
PEEP: 5 cmH2O
Patient temperature: 37
RATE: 16 resp/min
pCO2 arterial: 33 mmHg (ref 32.0–48.0)
pH, Arterial: 7.43 (ref 7.350–7.450)
pO2, Arterial: 81 mmHg — ABNORMAL LOW (ref 83.0–108.0)

## 2021-07-06 LAB — COMPREHENSIVE METABOLIC PANEL
ALT: 10 U/L (ref 0–44)
AST: 18 U/L (ref 15–41)
Albumin: 3.4 g/dL — ABNORMAL LOW (ref 3.5–5.0)
Alkaline Phosphatase: 43 U/L (ref 38–126)
Anion gap: 9 (ref 5–15)
BUN: 26 mg/dL — ABNORMAL HIGH (ref 8–23)
CO2: 25 mmol/L (ref 22–32)
Calcium: 8.2 mg/dL — ABNORMAL LOW (ref 8.9–10.3)
Chloride: 105 mmol/L (ref 98–111)
Creatinine, Ser: 1.64 mg/dL — ABNORMAL HIGH (ref 0.61–1.24)
GFR, Estimated: 39 mL/min — ABNORMAL LOW (ref >60)
Glucose, Bld: 108 mg/dL — ABNORMAL HIGH (ref 70–99)
Potassium: 4.8 mmol/L (ref 3.5–5.1)
Sodium: 139 mmol/L (ref 135–145)
Total Bilirubin: 0.6 mg/dL (ref 0.3–1.2)
Total Protein: 6.1 g/dL — ABNORMAL LOW (ref 6.5–8.1)

## 2021-07-06 LAB — PHOSPHORUS: Phosphorus: 2.2 mg/dL — ABNORMAL LOW (ref 2.5–4.6)

## 2021-07-06 LAB — LIPID PANEL
Cholesterol: 128 mg/dL (ref 0–200)
HDL: 39 mg/dL — ABNORMAL LOW (ref >40)
LDL Cholesterol: 62 mg/dL (ref 0–99)
Total CHOL/HDL Ratio: 3.3 RATIO
Triglycerides: 136 mg/dL (ref <150)
VLDL: 27 mg/dL (ref 0–40)

## 2021-07-06 LAB — GLUCOSE, CAPILLARY
Glucose-Capillary: 96 mg/dL (ref 70–99)
Glucose-Capillary: 84 mg/dL (ref 70–99)

## 2021-07-06 LAB — BRAIN NATRIURETIC PEPTIDE: B Natriuretic Peptide: 217.4 pg/mL — ABNORMAL HIGH (ref 0.0–100.0)

## 2021-07-06 LAB — PROTIME-INR
INR: 1 (ref 0.8–1.2)
Prothrombin Time: 13.3 seconds (ref 11.4–15.2)

## 2021-07-06 LAB — RESP PANEL BY RT-PCR (FLU A&B, COVID) ARPGX2
Influenza A by PCR: NEGATIVE
Influenza B by PCR: NEGATIVE
SARS Coronavirus 2 by RT PCR: NEGATIVE

## 2021-07-06 LAB — TSH: TSH: 6.734 u[IU]/mL — ABNORMAL HIGH (ref 0.350–4.500)

## 2021-07-06 LAB — TROPONIN I (HIGH SENSITIVITY)
Troponin I (High Sensitivity): 12 ng/L (ref <18)
Troponin I (High Sensitivity): 15 ng/L (ref <18)

## 2021-07-06 LAB — T4, FREE: Free T4: 0.95 ng/dL (ref 0.61–1.12)

## 2021-07-06 LAB — HEMOGLOBIN A1C
Hgb A1c MFr Bld: 6 % — ABNORMAL HIGH (ref 4.8–5.6)
Mean Plasma Glucose: 125.5 mg/dL

## 2021-07-06 LAB — MAGNESIUM: Magnesium: 2.1 mg/dL (ref 1.7–2.4)

## 2021-07-06 SURGERY — TEMPORARY PACEMAKER

## 2021-07-06 MED ORDER — ARFORMOTEROL TARTRATE 15 MCG/2ML IN NEBU
15.0000 ug | INHALATION_SOLUTION | Freq: Two times a day (BID) | RESPIRATORY_TRACT | Status: DC
  Administered 2021-07-06 – 2021-07-08 (×4): 15 ug via RESPIRATORY_TRACT
  Filled 2021-07-06 (×6): qty 2

## 2021-07-06 MED ORDER — MONTELUKAST SODIUM 10 MG PO TABS
10.0000 mg | ORAL_TABLET | Freq: Every day | ORAL | Status: DC
  Administered 2021-07-07: 10 mg
  Filled 2021-07-06 (×2): qty 1

## 2021-07-06 MED ORDER — REVEFENACIN 175 MCG/3ML IN SOLN
175.0000 ug | Freq: Every day | RESPIRATORY_TRACT | Status: DC
  Administered 2021-07-06 – 2021-07-08 (×2): 175 ug via RESPIRATORY_TRACT
  Filled 2021-07-06 (×3): qty 3

## 2021-07-06 MED ORDER — PERFLUTREN LIPID MICROSPHERE
1.0000 mL | INTRAVENOUS | Status: AC | PRN
  Administered 2021-07-06: 2.5 mL via INTRAVENOUS
  Filled 2021-07-06: qty 10

## 2021-07-06 MED ORDER — LIDOCAINE HCL (PF) 1 % IJ SOLN
INTRAMUSCULAR | Status: AC
  Filled 2021-07-06: qty 30

## 2021-07-06 MED ORDER — MIDAZOLAM HCL 2 MG/2ML IJ SOLN
2.0000 mg | Freq: Once | INTRAMUSCULAR | Status: AC
  Administered 2021-07-06: 2 mg via INTRAVENOUS

## 2021-07-06 MED ORDER — FENTANYL BOLUS VIA INFUSION
25.0000 ug | INTRAVENOUS | Status: DC | PRN
Start: 2021-07-06 — End: 2021-07-06
  Filled 2021-07-06: qty 25

## 2021-07-06 MED ORDER — PROPOFOL 1000 MG/100ML IV EMUL
INTRAVENOUS | Status: AC
  Filled 2021-07-06: qty 100

## 2021-07-06 MED ORDER — PANTOPRAZOLE SODIUM 40 MG PO PACK: 40.0000 mg | PACK | Freq: Every day | ORAL | Status: DC

## 2021-07-06 MED ORDER — SODIUM CHLORIDE 0.9 % IV SOLN: Freq: Once | INTRAVENOUS | Status: AC

## 2021-07-06 MED ORDER — SODIUM CHLORIDE 0.45 % IV SOLN: INTRAVENOUS | Status: DC

## 2021-07-06 MED ORDER — ENOXAPARIN SODIUM 30 MG/0.3ML IJ SOSY
30.0000 mg | PREFILLED_SYRINGE | INTRAMUSCULAR | Status: DC
  Administered 2021-07-06: 30 mg via SUBCUTANEOUS
  Filled 2021-07-06: qty 0.3

## 2021-07-06 MED ORDER — ETOMIDATE 2 MG/ML IV SOLN
INTRAVENOUS | Status: AC | PRN
  Administered 2021-07-06: 20 mg via INTRAVENOUS

## 2021-07-06 MED ORDER — CEFAZOLIN SODIUM-DEXTROSE 2-4 GM/100ML-% IV SOLN: 2.0000 g | INTRAVENOUS | Status: DC

## 2021-07-06 MED ORDER — CLOPIDOGREL BISULFATE 75 MG PO TABS
75.0000 mg | ORAL_TABLET | Freq: Every day | ORAL | Status: DC
  Administered 2021-07-07: 75 mg
  Filled 2021-07-06: qty 1

## 2021-07-06 MED ORDER — INSULIN ASPART 100 UNIT/ML IJ SOLN: 0.0000 [IU] | INTRAMUSCULAR | Status: DC

## 2021-07-06 MED ORDER — LACTATED RINGERS IV BOLUS
1000.0000 mL | Freq: Once | INTRAVENOUS | Status: AC
  Administered 2021-07-06: 1000 mL via INTRAVENOUS

## 2021-07-06 MED ORDER — POLYETHYLENE GLYCOL 3350 17 G PO PACK: 17.0000 g | PACK | Freq: Every day | ORAL | Status: DC | PRN

## 2021-07-06 MED ORDER — FENTANYL 2500MCG IN NS 250ML (10MCG/ML) PREMIX INFUSION
25.0000 ug/h | INTRAVENOUS | Status: DC
  Administered 2021-07-06: 25 ug/h via INTRAVENOUS
  Filled 2021-07-06: qty 250

## 2021-07-06 MED ORDER — SODIUM CHLORIDE 0.9 % IV SOLN: INTRAVENOUS | Status: DC

## 2021-07-06 MED ORDER — DOCUSATE SODIUM 50 MG/5ML PO LIQD: 100.0000 mg | Freq: Two times a day (BID) | ORAL | Status: DC | PRN

## 2021-07-06 MED ORDER — IPRATROPIUM-ALBUTEROL 0.5-2.5 (3) MG/3ML IN SOLN: 3.0000 mL | Freq: Four times a day (QID) | RESPIRATORY_TRACT | Status: DC | PRN

## 2021-07-06 MED ORDER — ACETAMINOPHEN 325 MG PO TABS: 650.0000 mg | ORAL_TABLET | ORAL | Status: DC | PRN

## 2021-07-06 MED ORDER — SIMVASTATIN 20 MG PO TABS
20.0000 mg | ORAL_TABLET | Freq: Every day | ORAL | Status: DC
  Administered 2021-07-06 – 2021-07-07 (×2): 20 mg
  Filled 2021-07-06 (×2): qty 1

## 2021-07-06 MED ORDER — FENTANYL CITRATE (PF) 100 MCG/2ML IJ SOLN: 50.0000 ug | Freq: Once | INTRAMUSCULAR | Status: DC

## 2021-07-06 MED ORDER — ROCURONIUM BROMIDE 50 MG/5ML IV SOLN
INTRAVENOUS | Status: AC | PRN
  Administered 2021-07-06: 80 mg via INTRAVENOUS

## 2021-07-06 MED ORDER — HEPARIN (PORCINE) IN NACL 1000-0.9 UT/500ML-% IV SOLN
INTRAVENOUS | Status: DC | PRN
  Administered 2021-07-06: 500 mL

## 2021-07-06 MED ORDER — BUDESONIDE 0.5 MG/2ML IN SUSP
0.5000 mg | Freq: Two times a day (BID) | RESPIRATORY_TRACT | Status: DC
  Administered 2021-07-06 – 2021-07-08 (×4): 0.5 mg via RESPIRATORY_TRACT
  Filled 2021-07-06 (×5): qty 2

## 2021-07-06 MED ORDER — INSULIN ASPART 100 UNIT/ML IJ SOLN: 0.0000 [IU] | Freq: Three times a day (TID) | INTRAMUSCULAR | Status: DC

## 2021-07-06 MED ORDER — ATROPINE SULFATE 1 MG/ML IJ SOLN
INTRAMUSCULAR | Status: AC | PRN
  Administered 2021-07-06: 1 mg via INTRAVENOUS

## 2021-07-06 MED ORDER — PROPOFOL 1000 MG/100ML IV EMUL
INTRAVENOUS | Status: DC | PRN
  Administered 2021-07-06: 20 ug/kg/min via INTRAVENOUS

## 2021-07-06 MED ORDER — SODIUM CHLORIDE 0.9 % IV SOLN
INTRAVENOUS | Status: DC | PRN
  Administered 2021-07-06: 100 mL/h via INTRAVENOUS

## 2021-07-06 MED ORDER — LIDOCAINE HCL (PF) 1 % IJ SOLN
INTRAMUSCULAR | Status: DC | PRN
  Administered 2021-07-06: 8 mL

## 2021-07-06 MED ORDER — MIDAZOLAM HCL 2 MG/2ML IJ SOLN
INTRAMUSCULAR | Status: AC
  Filled 2021-07-06: qty 2

## 2021-07-06 SURGICAL SUPPLY — 12 items
CABLE ADAPT PACING TEMP 12FT (ADAPTER) ×1 IMPLANT
CATH S G BIP PACING (CATHETERS) ×1 IMPLANT
KIT SYRINGE INJ CVI SPIKEX1 (MISCELLANEOUS) IMPLANT
NDL PERC 18GX7CM (NEEDLE) IMPLANT
NDL PERC 21GX4CM (NEEDLE) IMPLANT
NEEDLE PERC 18GX7CM (NEEDLE) ×2 IMPLANT
NEEDLE PERC 21GX4CM (NEEDLE) IMPLANT
PACK CARDIAC CATH (CUSTOM PROCEDURE TRAY) ×2 IMPLANT
SHEATH AVANTI 6FR X 11CM (SHEATH) ×1 IMPLANT
SLEEVE REPOSITIONING LENGTH 30 (MISCELLANEOUS) ×1 IMPLANT
SUT SILK 0 FSL (SUTURE) ×1 IMPLANT
WIRE GUIDERIGHT .035X150 (WIRE) IMPLANT

## 2021-07-06 NOTE — ED Provider Notes (Signed)
[facility] Emergency Department Provider Note  ____________________________________________   Event Date/Time   First MD Initiated Contact with Patient 07/06/21 0805     (approximate)  I have reviewed the triage vital signs and the nursing notes.   HISTORY  Chief Complaint Bradycardia    HPI Alexander Webster is a 87 y.o. male with history of syncope currently with loop recorder in place here with weakness.  EMS was called to the scene due to patient weakness.  He was reportedly found to have a heart rate in the 20s, and blood pressure in the 60s systolic.  He was immediately placed externally and brought to the ED.  Received Versed and fentanyl in route due to pain and intolerance of the pacing.  He arrives in moderate distress, in pain, complaining of pain at the pacing sites.  He is altered due to medications.  Remainder of history limited due to patient's status.    Past Medical History:  Diagnosis Date   Dysrhythmia    possible cause of syncope-    Leg fracture    Syncope     Patient Active Problem List   Diagnosis Date Noted   Symptomatic bradycardia 07/06/2021   Gastroesophageal reflux disease    Aspiration pneumonia (HCC) 03/17/2021   Acute respiratory failure (HCC) 03/17/2021   Cough 08/28/2014   Vasomotor rhinitis 08/28/2014    Past Surgical History:  Procedure Laterality Date   HERNIA REPAIR Bilateral 05-24-12   Dr Evette Cristal   LEG SURGERY Right 02/08/2015   LOOP RECORDER INSERTION N/A 10/30/2020   Procedure: LOOP RECORDER INSERTION;  Surgeon: Marcina Millard, MD;  Location: [facility] INVASIVE CV LAB;  Service: Cardiovascular;  Laterality: N/A;   MUSCLE REPAIR Right    calf   ruptured disc     ruptured disc repair    Prior to Admission medications   Medication Sig Start Date End Date Taking? Authorizing Provider  clopidogrel (PLAVIX) 75 MG tablet Take 75 mg by mouth at bedtime. 08/15/20   [provider]  metoprolol succinate  (TOPROL-XL) 25 MG 24 hr tablet Take 1 tablet by mouth daily. 02/04/21   [provider]  Misc Natural Products (ADV TURMERIC CURCUMIN COMPLEX PO) Take 1 capsule by mouth in the morning and at bedtime. Primal Force Curcumin Triple Burn    [provider]  montelukast (SINGULAIR) 10 MG tablet Take 10 mg by mouth daily. 08/15/20   [provider]  pantoprazole (PROTONIX) 40 MG tablet Take 1 tablet (40 mg total) by mouth daily. 03/18/21   Enedina Finner, MD  simvastatin (ZOCOR) 20 MG tablet Take 20 mg by mouth at bedtime. 09/14/20   [provider]  TRELEGY ELLIPTA 100-62.5-25 MCG/INH AEPB Inhale 1 puff into the lungs daily. 05/21/21   [provider]    Allergies Percocet [oxycodone-acetaminophen] and Vicodin [hydrocodone-acetaminophen]  Family History  Problem Relation Age of Onset   Heart disease Mother    Cancer Mother        lymphoma   Cancer Father        prostate    Social History Social History   Tobacco Use   Smoking status: Former    Packs/day: 1.00    Years: 20.00    Pack years: 20.00    Types: Cigarettes    Quit date: 08/28/1994    Years since quitting: 26.8   Smokeless tobacco: Never   Tobacco comments:    Smoked cigarettes, pipe.   Vaping Use   Vaping Use:  Never used  Substance Use Topics   Alcohol use: No    Alcohol/week: 0.0 standard drinks   Drug use: No    Review of Systems  Review of Systems  Unable to perform ROS: Acuity of condition    ____________________________________________  PHYSICAL EXAM:      VITAL SIGNS: ED Triage Vitals [07/06/21 0805]  Enc Vitals Group     BP 118/72     Pulse Rate 70     Resp 17     Temp      Temp src      SpO2 100 %     Weight      Height      Head Circumference      Peak Flow      Pain Score      Pain Loc      Pain Edu?      Excl. in GC?      Physical Exam Vitals and nursing note reviewed.  Constitutional:      General: He is not in acute distress.     Appearance: He is well-developed.     Comments: Actively being externally paced, in distress due to pain  HENT:     Head: Normocephalic and atraumatic.     Mouth/Throat:     Mouth: Mucous membranes are dry.  Eyes:     Conjunctiva/sclera: Conjunctivae normal.  Cardiovascular:     Rate and Rhythm: Regular rhythm. Bradycardia present.     Heart sounds: Normal heart sounds.  Pulmonary:     Effort: Pulmonary effort is normal. No respiratory distress.     Breath sounds: No wheezing.  Abdominal:     General: There is no distension.  Musculoskeletal:     Cervical back: Neck supple.  Skin:    General: Skin is warm.     Capillary Refill: Capillary refill takes less than 2 seconds.     Findings: No rash.  Neurological:     Mental Status: He is alert and oriented to person, place, and time.     Motor: No abnormal muscle tone.      ____________________________________________   LABS (all labs ordered are listed, but only abnormal results are displayed)  Labs Reviewed  CBC WITH DIFFERENTIAL/PLATELET - Abnormal; Notable for the following components:      Result Value   RBC 3.85 (*)    Hemoglobin 12.5 (*)    HCT 37.9 (*)    All other components within normal limits  COMPREHENSIVE METABOLIC PANEL - Abnormal; Notable for the following components:   Glucose, Bld 108 (*)    BUN 26 (*)    Creatinine, Ser 1.64 (*)    Calcium 8.2 (*)    Total Protein 6.1 (*)    Albumin 3.4 (*)    GFR, Estimated 39 (*)    All other components within normal limits  BRAIN NATRIURETIC PEPTIDE - Abnormal; Notable for the following components:   B Natriuretic Peptide 217.4 (*)    All other components within normal limits  TSH - Abnormal; Notable for the following components:   TSH 6.734 (*)    All other components within normal limits  RESP PANEL BY RT-PCR (FLU A&B, COVID) ARPGX2  MAGNESIUM  BLOOD GAS, ARTERIAL  TROPONIN I (HIGH SENSITIVITY)    ____________________________________________  EKG:  Complete heart block, ventricular rate extremely slow, less than 30.  QRS 140, when compared to prior complete heart block is present. ________________________________________  RADIOLOGY All imaging, including plain films, CT scans,  and ultrasounds, independently reviewed by me, and interpretations confirmed via formal radiology reads.  ED MD interpretation:   Chest x-ray: Appropriate ET tube placement, possible atelectasis in the right infrahilar region  Official radiology report(s): DG Chest Portable 1 View  Result Date: 07/06/2021 CLINICAL DATA:  Intubation and orogastric tube placement EXAM: PORTABLE CHEST 1 VIEW COMPARISON:  03/17/2021 FINDINGS: Endotracheal tube terminates 2.8 cm above carina. Overlying pacer/defibrillator. Midline trachea. Normal heart size for level of inspiration. Atherosclerosis in the transverse aorta. No pleural effusion or pneumothorax. No congestive failure. Possible right infrahilar airspace disease. Lower left chest not well evaluated secondary to pacer/defibrillator. IMPRESSION: Appropriate position of endotracheal tube. Suspicion of right infrahilar atelectasis or early infection/aspiration. Aortic Atherosclerosis (ICD10-I70.0). Electronically Signed   By: Jeronimo Greaves M.D.   On: 07/06/2021 08:57    ____________________________________________  PROCEDURES   Procedure(s) performed (including Critical Care):  .Critical Care  Date/Time: 07/06/2021 9:12 AM Performed by: Shaune Pollack, MD Authorized by: Shaune Pollack, MD   Critical care provider statement:    Critical care time (minutes):  35   Critical care time was exclusive of:  Separately billable procedures and treating other patients and teaching time   Critical care was necessary to treat or prevent imminent or life-threatening deterioration of the following conditions:  Circulatory failure, cardiac failure and respiratory failure   Critical care was time spent personally by me on the following  activities:  Development of treatment plan with patient or surrogate, discussions with consultants, evaluation of patient's response to treatment, examination of patient, obtaining history from patient or surrogate, ordering and performing treatments and interventions, ordering and review of laboratory studies, ordering and review of radiographic studies, pulse oximetry, re-evaluation of patient's condition and review of old charts   I assumed direction of critical care for this patient from another provider in my specialty: no   Procedure Name: Intubation Date/Time: 07/06/2021 9:12 AM Performed by: Shaune Pollack, MD Pre-anesthesia Checklist: Patient identified Oxygen Delivery Method: Simple face mask Preoxygenation: Pre-oxygenation with 100% oxygen Induction Type: IV induction Ventilation: Mask ventilation without difficulty Laryngoscope Size: Glidescope and 4 Grade View: Grade I Tube size: 7.5 mm Number of attempts: 1 Airway Equipment and Method: Rigid stylet and Video-laryngoscopy Placement Confirmation: ETT inserted through vocal cords under direct vision Secured at: 23 cm Tube secured with: ETT holder Dental Injury: Teeth and Oropharynx as per pre-operative assessment  Difficulty Due To: Difficulty was unanticipated Future Recommendations: Recommend- induction with short-acting agent, and alternative techniques readily available Comments: Patient did have possible slight subglottic stenosis noted that was easily resolved with a slight turn of the endotracheal tube.     ____________________________________________  INITIAL IMPRESSION / MDM / ASSESSMENT AND PLAN / ED COURSE  As part of my medical decision making, I reviewed the following data within the electronic MEDICAL RECORD NUMBER Nursing notes reviewed and incorporated, Old chart reviewed, Notes from prior ED visits, and [place] Controlled Substance Database       *DAMONI CAUSBY was evaluated in Emergency Department on 07/06/2021 for  the symptoms described in the history of present illness. He was evaluated in the context of the global COVID-19 pandemic, which necessitated consideration that the patient might be at risk for infection with the SARS-CoV-2 virus that causes COVID-19. Institutional protocols and algorithms that pertain to the evaluation of patients at risk for COVID-19 are in a state of rapid change based on information released by regulatory bodies including the CDC and federal and state organizations. These policies and  algorithms were followed during the patient's care in the ED.  Some ED evaluations and interventions may be delayed as a result of limited staffing during the pandemic.*     Medical Decision Making: 87 year old male here with complete heart block.  Patient is completely dependent on external pacing.  Atropine given without improvement.  EKG shows third-degree heart block.  Patient blood pressure is maintained on external pacing but patient is in obvious distress despite being sedated.  Decision subsequently to intubate for airway protection as well as analgesia while pacing.  Dr. Darrold JunkerParaschos consulted with cardiology, will take patient for external temporary pacemaker.  Wife updated and is in agreement and understanding.  She is elderly and will not be able to come to the hospital but is in agreement with this plan.  ICU consulted for admission. Pacer briefly paused and underlying rhythm remained CHB.  Chest x-ray obtained, shows appropriate ET tube placement.  Initial lab work largely unremarkable with exception of mild possible AKI due to hypoperfusion.  Magnesium normal.  Lites otherwise acceptable.  ____________________________________________  FINAL CLINICAL IMPRESSION(S) / ED DIAGNOSES  Final diagnoses:  Complete heart block (HCC)     MEDICATIONS GIVEN DURING THIS VISIT:  Medications  fentaNYL (SUBLIMAZE) injection 50 mcg (has no administration in time range)  fentaNYL 2500mcg in NS 250mL  (6110mcg/ml) infusion-PREMIX (has no administration in time range)  fentaNYL (SUBLIMAZE) bolus via infusion 25 mcg (has no administration in time range)  propofol (DIPRIVAN) 1000 MG/100ML infusion ( Intravenous Canceled Entry 07/06/21 0815)  atropine injection (1 mg Intravenous Given 07/06/21 0754)  etomidate (AMIDATE) injection (20 mg Intravenous Given 07/06/21 0759)  rocuronium (ZEMURON) injection (80 mg Intravenous Given 07/06/21 0800)  0.9 %  sodium chloride infusion ( Intravenous New Bag/Given 07/06/21 0827)     ED Discharge Orders     None        Note:  This document was prepared using Dragon voice recognition software and may include unintentional dictation errors.   Shaune PollackIsaacs, Tab Rylee, MD 07/06/21 [phone]

## 2021-07-06 NOTE — ED Notes (Signed)
Report given to Cath lab.

## 2021-07-06 NOTE — Progress Notes (Signed)
*  PRELIMINARY RESULTS* Echocardiogram 2D Echocardiogram has been performed. A Saline Bubble Study was requested and performed, and Definity IV Contrast was used on this study.  Lenor Coffin 07/06/2021, 3:09 PM

## 2021-07-06 NOTE — H&P (Signed)
NAME:  Alexander Webster, MRN:  [id number], DOB:  11-03-1928, LOS: 0 ADMISSION DATE:  07/06/2021, CONSULTATION DATE:  07/06/21  REFERRING MD:  Erma Heritage, CHIEF COMPLAINT:  Heart block   History of Present Illness:  87 y.o. male with h/o syncope, CAD on Plavix and CKD admitted with complete heart block. Had near syncopal episode at home and was found to be in complete heart block with profound hypotension and heart rate in the 30s on EMS arrival. He was started on external pacing en route with improvement in blood pressure and heart rate. He was subsequently intubated with 7.5 ETT in the ED due to high sedation requirement to tolerate external pacing. Dr. Darrold Junker was consulted and plans for temporary pacemaker placement.  Pertinent  Medical History  CAD Recurrent syncope CKD Undocumented COPD Allergic rhinitis  Significant Hospital Events: Including procedures, antibiotic start and stop dates in addition to other pertinent events   8/21: Admitted with heart block, intubated, temp pacer placed  Interim History / Subjective:  N/A  Objective   Blood pressure 120/69, pulse 69, temperature (!) 96.9 F (36.1 C), temperature source Rectal, resp. rate 19, height 5\' 7"  (1.702 m), weight 86.3 kg, SpO2 98 %.    FiO2 (%):  [40 %] 40 %   Intake/Output Summary (Last 24 hours) at 07/06/2021 07/08/2021 Last data filed at 07/06/2021 0827 Gross per 24 hour  Intake 1000 ml  Output --  Net 1000 ml   Filed Weights   07/06/21 0900  Weight: 86.3 kg    Examination: General: elderly male, intubated HENT: [place]/AT, pupils equal, round and reactive Lungs: CTA b/l, no W/C/R Cardiovascular: RRR, no M/R/G Abdomen: soft, NTND, NABS Extremities: no C/C/E Neuro: intubated and sedated GU: Foley in place  Resolved Hospital Problem list   N/A  Assessment & Plan:  Intubated for sedation, airway protection Cardiogenic shock 2/2 complete heart block Undocumented COPD not in acute exacerbation CKD IV  - [facility]  Cardiology following - Plan for temp pacer today; leadless pacer tomorrow - Assess for possible extubation once temp pacer in place - LTVV 6-8 cc/kg/IBW while intubated - Minimize sedation, RASS goal 0 - Resume home bronchodilator therapy with LAMA/LABA/ICS - Obtain TTE - Defer initiation of systemic anticoagulation to Cardiology - Resume home Plavix tomorrow; continue home statin - Creatinine at baseline - Monitor UOP, renal fxn, lytes; renally dose meds  Best Practice (right click and "Reselect all SmartList Selections" daily)   Diet/type: NPO w/ meds via tube DVT prophylaxis: prophylactic heparin  GI prophylaxis: PPI Lines: Central line (TempPerm PPM) Foley:  Yes, and it is still needed Code Status:  full code Last date of multidisciplinary goals of care discussion [N/A]  Labs   CBC: Recent Labs  Lab 07/06/21 0807  WBC 7.1  NEUTROABS 4.2  HGB 12.5*  HCT 37.9*  MCV 98.4  PLT 214    Basic Metabolic Panel: Recent Labs  Lab 07/06/21 0807  NA 139  K 4.8  CL 105  CO2 25  GLUCOSE 108*  BUN 26*  CREATININE 1.64*  CALCIUM 8.2*  MG 2.1   GFR: Estimated Creatinine Clearance: 31.4 mL/min (A) (by C-G formula based on SCr of 1.64 mg/dL (H)). Recent Labs  Lab 07/06/21 0807  WBC 7.1    Liver Function Tests: Recent Labs  Lab 07/06/21 0807  AST 18  ALT 10  ALKPHOS 43  BILITOT 0.6  PROT 6.1*  ALBUMIN 3.4*   No results for input(s): LIPASE, AMYLASE in the last 168  hours. No results for input(s): AMMONIA in the last 168 hours.  ABG No results found for: PHART, PCO2ART, PO2ART, HCO3, TCO2, ACIDBASEDEF, O2SAT   Coagulation Profile: No results for input(s): INR, PROTIME in the last 168 hours.  Cardiac Enzymes: No results for input(s): CKTOTAL, CKMB, CKMBINDEX, TROPONINI in the last 168 hours.  HbA1C: No results found for: HGBA1C  CBG: No results for input(s): GLUCAP in the last 168 hours.  Review of Systems:   Unable to assess due to patient  status.  Past Medical History:  He,  has a past medical history of Dysrhythmia, Leg fracture, and Syncope.   Surgical History:   Past Surgical History:  Procedure Laterality Date   HERNIA REPAIR Bilateral 05-24-12   Dr Evette Cristal   LEG SURGERY Right 02/08/2015   LOOP RECORDER INSERTION N/A 10/30/2020   Procedure: LOOP RECORDER INSERTION;  Surgeon: Marcina Millard, MD;  Location: [facility] INVASIVE CV LAB;  Service: Cardiovascular;  Laterality: N/A;   MUSCLE REPAIR Right    calf   ruptured disc     ruptured disc repair     Social History:   reports that he quit smoking about 26 years ago. His smoking use included cigarettes. He has a 20.00 pack-year smoking history. He has never used smokeless tobacco. He reports that he does not drink alcohol and does not use drugs.   Family History:  His family history includes Cancer in his father and mother; Heart disease in his mother.   Allergies Allergies  Allergen Reactions   Percocet [Oxycodone-Acetaminophen] Itching    Itching   Vicodin [Hydrocodone-Acetaminophen] Itching     Home Medications  Prior to Admission medications   Medication Sig Start Date End Date Taking? Authorizing Provider  clopidogrel (PLAVIX) 75 MG tablet Take 75 mg by mouth at bedtime. 08/15/20   [provider]  metoprolol succinate (TOPROL-XL) 25 MG 24 hr tablet Take 1 tablet by mouth daily. 02/04/21   [provider]  Misc Natural Products (ADV TURMERIC CURCUMIN COMPLEX PO) Take 1 capsule by mouth in the morning and at bedtime. Primal Force Curcumin Triple Burn    [provider]  montelukast (SINGULAIR) 10 MG tablet Take 10 mg by mouth daily. 08/15/20   [provider]  pantoprazole (PROTONIX) 40 MG tablet Take 1 tablet (40 mg total) by mouth daily. 03/18/21   Enedina Finner, MD  simvastatin (ZOCOR) 20 MG tablet Take 20 mg by mouth at bedtime. 09/14/20   [provider]  TRELEGY ELLIPTA 100-62.5-25 MCG/INH AEPB Inhale 1 puff into  the lungs daily. 05/21/21   [provider]     Critical care time: 50 minutes

## 2021-07-06 NOTE — Consult Note (Signed)
[facility] Card[facility]  CARDIOLOGY CONSULT NOTE  Patient ID: Alexander Webster MRN: [id number] DOB/AGE: 85/05/1928 85 y.o.  Admit date: 07/06/2021 Referring Physician Issacs Primary Physician [facility] Primary Cardiologist Fathh Reason for Consultation complete heart block  HPI: 87 year old gentleman referred for complete heart block.  The patient has a long history of recurrent syncope followed by Dr. Lady GaryFath.  Undergone [facility].  He also has been evaluated by neurology.  Patient underwent Linq implantation 10/30/2020.  He was in his usual state of health until earlier today when he experienced a near syncopal episode followed by fatigue.  EMS was called.  Upon arrival ECG revealed complete heart block with a heart rate of 30 bpm and blood pressure of 70/40.  External pacing was started.  [facility] where ECG revealed high-grade AV block.  Patient required sedation in order to tolerate external pacing lead to lethargy and eventual intubation.  Review of systems complete and found to be negative unless listed above     Past Medical History:  Diagnosis Date   Dysrhythmia    possible cause of syncope-    Leg fracture    Syncope     Past Surgical History:  Procedure Laterality Date   HERNIA REPAIR Bilateral 05-24-12   Dr Evette CristalSankar   LEG SURGERY Right 02/08/2015   LOOP RECORDER INSERTION N/A 10/30/2020   Procedure: LOOP RECORDER INSERTION;  Surgeon: Marcina MillardParaschos, Yoshie Kosel, MD;  Location: [facility] INVASIVE CV LAB;  Service: Cardiovascular;  Laterality: N/A;   MUSCLE REPAIR Right    calf   ruptured disc     ruptured disc repair    (Not in a hospital admission)  Social History   Socioeconomic History   Marital status: Married    Spouse name: Not on file   Number of children: Not on file   Years of education: Not on file   Highest education level: Not on file  Occupational History   Not on file  Tobacco Use   Smoking  status: Former    Packs/day: 1.00    Years: 20.00    Pack years: 20.00    Types: Cigarettes    Quit date: 08/28/1994    Years since quitting: 26.8   Smokeless tobacco: Never   Tobacco comments:    Smoked cigarettes, pipe.   Vaping Use   Vaping Use: Never used  Substance and Sexual Activity   Alcohol use: No    Alcohol/week: 0.0 standard drinks   Drug use: No   Sexual activity: Not on file  Other Topics Concern   Not on file  Social History Narrative   Not on file   Social Determinants of Health   Financial Resource Strain: Not on file  Food Insecurity: Not on file  Transportation Needs: Not on file  Physical Activity: Not on file  Stress: Not on file  Social Connections: Not on file  Intimate Partner Violence: Not on file    Family History  Problem Relation Age of Onset   Heart disease Mother    Cancer Mother        lymphoma   Cancer Father        prostate      Review of systems complete and found to be negative unless listed above      PHYSICAL EXAM  General: Well developed, well nourished, in no acute distress HEENT:  Normocephalic and atramatic Neck:  No JVD.  Lungs: Clear bilaterally to auscultation and  percussion. Heart: HRRR . Normal S1 and S2 without gallops or murmurs.  Abdomen: Bowel sounds are positive, abdomen soft and non-tender  Msk:  Back normal, normal gait. Normal strength and tone for age. Extremities: No clubbing, cyanosis or edema.   Neuro: Alert and oriented X 3. Psych:  Good affect, responds appropriately  Labs:   Lab Results  Component Value Date   WBC 7.1 07/06/2021   HGB 12.5 (L) 07/06/2021   HCT 37.9 (L) 07/06/2021   MCV 98.4 07/06/2021   PLT 214 07/06/2021   No results for input(s): NA, K, CL, CO2, BUN, CREATININE, CALCIUM, PROT, BILITOT, ALKPHOS, ALT, AST, GLUCOSE in the last 168 hours.  Invalid input(s): LABALBU Lab Results  Component Value Date   CKTOTAL 349 02/08/2015   No results found for: CHOL No results  found for: HDL No results found for: LDLCALC No results found for: TRIG No results found for: CHOLHDL No results found for: LDLDIRECT    Radiology: DG Shoulder Right  Result Date: 06/30/2021 CLINICAL DATA:  Right shoulder pain after fall today. EXAM: RIGHT SHOULDER - 2+ VIEW COMPARISON:  None. FINDINGS: There is no evidence of fracture or dislocation. Moderate narrowing of right glenohumeral joint is noted. Soft tissues are unremarkable. IMPRESSION: Moderate degenerative joint disease of right glenohumeral joint. No acute abnormality is noted. Electronically Signed   By: Lupita Raider M.D.   On: 06/30/2021 15:01   CT HEAD WO CONTRAST ( )  Result Date: 06/30/2021 CLINICAL DATA:  Facial trauma.  Fall striking head on concrete. Facial trauma EXAM: CT HEAD WITHOUT CONTRAST CT MAXILLOFACIAL WITHOUT CONTRAST CT CERVICAL SPINE WITHOUT CONTRAST TECHNIQUE: Multidetector CT imaging of the head, cervical spine, and maxillofacial structures were performed using the standard protocol without intravenous contrast. Multiplanar CT image reconstructions of the cervical spine and maxillofacial structures were also generated. COMPARISON:  None. FINDINGS: CT HEAD FINDINGS Brain: Age related atrophy and chronic small vessel ischemia. No intracranial hemorrhage, mass effect, or midline shift. No hydrocephalus. The basilar cisterns are patent. No evidence of territorial infarct or acute ischemia. No extra-axial or intracranial fluid collection. Vascular: Atherosclerosis of skullbase vasculature without hyperdense vessel or abnormal calcification. Skull: No fracture or focal lesion. Other: Paranasal sinuses and mastoid air cells are clear. The visualized orbits are unremarkable. Bilateral cataract resection CT MAXILLOFACIAL FINDINGS Osseous: No acute fracture of the nasal bone, zygomatic arches, and mandibles. The temporomandibular joints are congruent. Nasal septum is midline. Orbits: No orbital fracture or globe injury.  Bilateral cataract resection. No orbital inflammation. Sinuses: No sinus fracture or fluid level. Paranasal sinuses are clear. No mastoid effusion. Soft tissues: Mild right face soft tissue edema. No confluent hematoma. CT CERVICAL SPINE FINDINGS Alignment: No traumatic malalignment. There is grade 1 anterolisthesis of C4 on C5 that is likely facet mediated. Skull base and vertebrae: No acute fracture. Vertebral body heights are maintained. The dens and skull base are intact. There is degenerative pannus at C1-C2. Soft tissues and spinal canal: No prevertebral fluid or swelling. No visible canal hematoma. Disc levels: Degenerative disc disease at C5-C6 and C6-C7 with disc space narrowing and spurring. There is multilevel facet hypertrophy. No high-grade bony canal stenosis. Upper chest: No acute or unexpected findings, mildly motion obscured. Other: None. IMPRESSION: 1. No acute intracranial abnormality. No skull fracture. Age related atrophy and chronic small vessel ischemia. 2. No facial bone fracture. Mild right face soft tissue edema. 3. Degenerative change in the cervical spine without acute fracture or subluxation. Electronically Signed  By: Narda Rutherford M.D.   On: 06/30/2021 15:25   CT Cervical Spine Wo Contrast  Result Date: 06/30/2021 CLINICAL DATA:  Facial trauma.  Fall striking head on concrete. Facial trauma EXAM: CT HEAD WITHOUT CONTRAST CT MAXILLOFACIAL WITHOUT CONTRAST CT CERVICAL SPINE WITHOUT CONTRAST TECHNIQUE: Multidetector CT imaging of the head, cervical spine, and maxillofacial structures were performed using the standard protocol without intravenous contrast. Multiplanar CT image reconstructions of the cervical spine and maxillofacial structures were also generated. COMPARISON:  None. FINDINGS: CT HEAD FINDINGS Brain: Age related atrophy and chronic small vessel ischemia. No intracranial hemorrhage, mass effect, or midline shift. No hydrocephalus. The basilar cisterns are patent. No  evidence of territorial infarct or acute ischemia. No extra-axial or intracranial fluid collection. Vascular: Atherosclerosis of skullbase vasculature without hyperdense vessel or abnormal calcification. Skull: No fracture or focal lesion. Other: Paranasal sinuses and mastoid air cells are clear. The visualized orbits are unremarkable. Bilateral cataract resection CT MAXILLOFACIAL FINDINGS Osseous: No acute fracture of the nasal bone, zygomatic arches, and mandibles. The temporomandibular joints are congruent. Nasal septum is midline. Orbits: No orbital fracture or globe injury. Bilateral cataract resection. No orbital inflammation. Sinuses: No sinus fracture or fluid level. Paranasal sinuses are clear. No mastoid effusion. Soft tissues: Mild right face soft tissue edema. No confluent hematoma. CT CERVICAL SPINE FINDINGS Alignment: No traumatic malalignment. There is grade 1 anterolisthesis of C4 on C5 that is likely facet mediated. Skull base and vertebrae: No acute fracture. Vertebral body heights are maintained. The dens and skull base are intact. There is degenerative pannus at C1-C2. Soft tissues and spinal canal: No prevertebral fluid or swelling. No visible canal hematoma. Disc levels: Degenerative disc disease at C5-C6 and C6-C7 with disc space narrowing and spurring. There is multilevel facet hypertrophy. No high-grade bony canal stenosis. Upper chest: No acute or unexpected findings, mildly motion obscured. Other: None. IMPRESSION: 1. No acute intracranial abnormality. No skull fracture. Age related atrophy and chronic small vessel ischemia. 2. No facial bone fracture. Mild right face soft tissue edema. 3. Degenerative change in the cervical spine without acute fracture or subluxation. Electronically Signed   By: Narda Rutherford M.D.   On: 06/30/2021 15:25   CT MAXILLOFACIAL WO CONTRAST  Result Date: 06/30/2021 CLINICAL DATA:  Facial trauma.  Fall striking head on concrete. Facial trauma EXAM: CT HEAD  WITHOUT CONTRAST CT MAXILLOFACIAL WITHOUT CONTRAST CT CERVICAL SPINE WITHOUT CONTRAST TECHNIQUE: Multidetector CT imaging of the head, cervical spine, and maxillofacial structures were performed using the standard protocol without intravenous contrast. Multiplanar CT image reconstructions of the cervical spine and maxillofacial structures were also generated. COMPARISON:  None. FINDINGS: CT HEAD FINDINGS Brain: Age related atrophy and chronic small vessel ischemia. No intracranial hemorrhage, mass effect, or midline shift. No hydrocephalus. The basilar cisterns are patent. No evidence of territorial infarct or acute ischemia. No extra-axial or intracranial fluid collection. Vascular: Atherosclerosis of skullbase vasculature without hyperdense vessel or abnormal calcification. Skull: No fracture or focal lesion. Other: Paranasal sinuses and mastoid air cells are clear. The visualized orbits are unremarkable. Bilateral cataract resection CT MAXILLOFACIAL FINDINGS Osseous: No acute fracture of the nasal bone, zygomatic arches, and mandibles. The temporomandibular joints are congruent. Nasal septum is midline. Orbits: No orbital fracture or globe injury. Bilateral cataract resection. No orbital inflammation. Sinuses: No sinus fracture or fluid level. Paranasal sinuses are clear. No mastoid effusion. Soft tissues: Mild right face soft tissue edema. No confluent hematoma. CT CERVICAL SPINE FINDINGS Alignment: No traumatic malalignment. There  is grade 1 anterolisthesis of C4 on C5 that is likely facet mediated. Skull base and vertebrae: No acute fracture. Vertebral body heights are maintained. The dens and skull base are intact. There is degenerative pannus at C1-C2. Soft tissues and spinal canal: No prevertebral fluid or swelling. No visible canal hematoma. Disc levels: Degenerative disc disease at C5-C6 and C6-C7 with disc space narrowing and spurring. There is multilevel facet hypertrophy. No high-grade bony canal  stenosis. Upper chest: No acute or unexpected findings, mildly motion obscured. Other: None. IMPRESSION: 1. No acute intracranial abnormality. No skull fracture. Age related atrophy and chronic small vessel ischemia. 2. No facial bone fracture. Mild right face soft tissue edema. 3. Degenerative change in the cervical spine without acute fracture or subluxation. Electronically Signed   By: Narda Rutherford M.D.   On: 06/30/2021 15:25    EKG: High-grade AV block  ASSESSMENT AND PLAN:   1.  Complete heart block / high-grade AV block, with hemodynamic instability, requiring external pacing, and sedation resulting in intubation  Recommendations  1.  Emergent temporary transvenous pacemaker 2.  Micra AV versus traditional dual-chamber pacemaker once patient stabilized  Signed: Marcina Millard MD,PhD, [facility] 07/06/2021, 8:28 AM

## 2021-07-06 NOTE — Progress Notes (Signed)
Brief Nutrition Note  Consult received for enteral/tube feeding initiation and management.  Pt has been extubated and has been advanced to a dysphagia 3 diet. Pt with no feeding access and no plan to initiate TF at this time. If further nutrition-related needs arise, please consult RD.   Labs:  Recent Labs  Lab 07/06/21 0807 07/06/21 1037  NA 139  --   K 4.8  --   CL 105  --   CO2 25  --   BUN 26*  --   CREATININE 1.64*  --   CALCIUM 8.2*  --   MG 2.1  --   PHOS  --  2.2*  GLUCOSE 108*  --     Levada Schilling, RD, LDN, CDCES Registered Dietitian II Certified Diabetes Care and Education Specialist Please refer to [facility] for RD and/or RD on-call/weekend/after hours pager

## 2021-07-06 NOTE — ED Triage Notes (Signed)
Pt via EMS from home. Per EMS, pt called out for near syncope and fatigue. EMS EKG showed complete heart block. Initial BP and HR was 70/40 and HR 30. EMS started pacing pt in field. EMS gave 4 Versed, 50 of Fentanyl, and of fluid. Pt placed on NRB at 15L.   Pt is lethargic but responsive

## 2021-07-07 ENCOUNTER — Encounter
Admission: EM | Disposition: A | Discharge status: Home / Self Care | Payer: Self-pay | Source: Home / Self Care | Attending: Internal Medicine

## 2021-07-07 ENCOUNTER — Encounter: Payer: Self-pay | Admitting: Cardiology

## 2021-07-07 DIAGNOSIS — I442 Atrioventricular block, complete: Secondary | ICD-10-CM | POA: Diagnosis not present

## 2021-07-07 HISTORY — PX: PACEMAKER LEADLESS INSERTION: EP1219

## 2021-07-07 LAB — BASIC METABOLIC PANEL
Anion gap: 5 (ref 5–15)
BUN: 20 mg/dL (ref 8–23)
CO2: 23 mmol/L (ref 22–32)
Calcium: 8 mg/dL — ABNORMAL LOW (ref 8.9–10.3)
Chloride: 109 mmol/L (ref 98–111)
Creatinine, Ser: 1.23 mg/dL (ref 0.61–1.24)
GFR, Estimated: 56 mL/min — ABNORMAL LOW (ref >60)
Glucose, Bld: 106 mg/dL — ABNORMAL HIGH (ref 70–99)
Potassium: 4.2 mmol/L (ref 3.5–5.1)
Sodium: 137 mmol/L (ref 135–145)

## 2021-07-07 LAB — CBC
HCT: 38 % — ABNORMAL LOW (ref 39.0–52.0)
Hemoglobin: 12.5 g/dL — ABNORMAL LOW (ref 13.0–17.0)
MCH: 31.4 pg (ref 26.0–34.0)
MCHC: 32.9 g/dL (ref 30.0–36.0)
MCV: 95.5 fL (ref 80.0–100.0)
Platelets: 210 10*3/uL (ref 150–400)
RBC: 3.98 MIL/uL — ABNORMAL LOW (ref 4.22–5.81)
RDW: 14.1 % (ref 11.5–15.5)
WBC: 9.9 10*3/uL (ref 4.0–10.5)
nRBC: 0 % (ref 0.0–0.2)

## 2021-07-07 LAB — GLUCOSE, CAPILLARY
Glucose-Capillary: 108 mg/dL — ABNORMAL HIGH (ref 70–99)
Glucose-Capillary: 99 mg/dL (ref 70–99)

## 2021-07-07 LAB — PHOSPHORUS: Phosphorus: 3.4 mg/dL (ref 2.5–4.6)

## 2021-07-07 LAB — MAGNESIUM: Magnesium: 2.1 mg/dL (ref 1.7–2.4)

## 2021-07-07 SURGERY — PACEMAKER LEADLESS INSERTION
Anesthesia: Moderate Sedation

## 2021-07-07 MED ORDER — FENTANYL CITRATE (PF) 100 MCG/2ML IJ SOLN
INTRAMUSCULAR | Status: AC
  Filled 2021-07-07: qty 2

## 2021-07-07 MED ORDER — ONDANSETRON HCL 4 MG/2ML IJ SOLN: 4.0000 mg | Freq: Four times a day (QID) | INTRAMUSCULAR | Status: DC | PRN

## 2021-07-07 MED ORDER — IOHEXOL 300 MG/ML  SOLN
INTRAMUSCULAR | Status: DC | PRN
  Administered 2021-07-07: 40 mL

## 2021-07-07 MED ORDER — CEFAZOLIN SODIUM-DEXTROSE 1-4 GM/50ML-% IV SOLN
INTRAVENOUS | Status: AC
  Filled 2021-07-07: qty 50

## 2021-07-07 MED ORDER — CHLORHEXIDINE GLUCONATE CLOTH 2 % EX PADS
6.0000 | MEDICATED_PAD | Freq: Every day | CUTANEOUS | Status: DC
  Administered 2021-07-07: 6 via TOPICAL

## 2021-07-07 MED ORDER — SODIUM CHLORIDE 0.9% FLUSH
3.0000 mL | Freq: Two times a day (BID) | INTRAVENOUS | Status: DC
  Administered 2021-07-07 (×2): 3 mL via INTRAVENOUS

## 2021-07-07 MED ORDER — SODIUM CHLORIDE 0.9 % IV SOLN: 250.0000 mL | INTRAVENOUS | Status: DC | PRN

## 2021-07-07 MED ORDER — LIDOCAINE HCL (PF) 1 % IJ SOLN
INTRAMUSCULAR | Status: DC | PRN
  Administered 2021-07-07: 20 mL

## 2021-07-07 MED ORDER — HEPARIN (PORCINE) IN NACL 1000-0.9 UT/500ML-% IV SOLN
INTRAVENOUS | Status: DC | PRN
  Administered 2021-07-07: 1000 mL

## 2021-07-07 MED ORDER — CEFAZOLIN (ANCEF) 1 G IV SOLR
INTRAVENOUS | Status: DC | PRN
  Administered 2021-07-07: 1 g

## 2021-07-07 MED ORDER — HEPARIN (PORCINE) IN NACL 1000-0.9 UT/500ML-% IV SOLN
INTRAVENOUS | Status: AC
  Filled 2021-07-07: qty 1000

## 2021-07-07 MED ORDER — HEPARIN SODIUM (PORCINE) 1000 UNIT/ML IJ SOLN
INTRAMUSCULAR | Status: AC
  Filled 2021-07-07: qty 1

## 2021-07-07 MED ORDER — MIDAZOLAM HCL 2 MG/2ML IJ SOLN
INTRAMUSCULAR | Status: DC | PRN
  Administered 2021-07-07 (×2): 0.5 mg via INTRAVENOUS

## 2021-07-07 MED ORDER — LIDOCAINE HCL 1 % IJ SOLN
INTRAMUSCULAR | Status: AC
  Filled 2021-07-07: qty 20

## 2021-07-07 MED ORDER — MIDAZOLAM HCL 2 MG/2ML IJ SOLN
INTRAMUSCULAR | Status: AC
  Filled 2021-07-07: qty 2

## 2021-07-07 MED ORDER — ENOXAPARIN SODIUM 40 MG/0.4ML IJ SOSY
40.0000 mg | PREFILLED_SYRINGE | INTRAMUSCULAR | Status: DC
  Administered 2021-07-07: 40 mg via SUBCUTANEOUS
  Filled 2021-07-07: qty 0.4

## 2021-07-07 MED ORDER — FENTANYL CITRATE (PF) 100 MCG/2ML IJ SOLN
INTRAMUSCULAR | Status: DC | PRN
  Administered 2021-07-07 (×2): 25 ug via INTRAVENOUS

## 2021-07-07 MED ORDER — HEPARIN SODIUM (PORCINE) 1000 UNIT/ML IJ SOLN
INTRAMUSCULAR | Status: DC | PRN
  Administered 2021-07-07: 4000 [IU] via INTRAVENOUS
  Administered 2021-07-07: 2000 [IU] via INTRAVENOUS

## 2021-07-07 MED ORDER — SODIUM CHLORIDE 0.9% FLUSH: 3.0000 mL | INTRAVENOUS | Status: DC | PRN

## 2021-07-07 MED FILL — Medication: Qty: 1 | Status: AC

## 2021-07-07 SURGICAL SUPPLY — 14 items
DILATOR VESSEL 38 20CM 12FR (INTRODUCER) ×1 IMPLANT
DILATOR VESSEL 38 20CM 14FR (INTRODUCER) ×1 IMPLANT
DILATOR VESSEL 38 20CM 18FR (INTRODUCER) ×2 IMPLANT
DILATOR VESSEL 38 20CM 8FR (INTRODUCER) ×1 IMPLANT
MICRA AV TRANSCATH PACING SYS (Pacemaker) ×2 IMPLANT
MICRA INTRODUCER SHEATH (SHEATH) ×2
NDL PERC 18GX7CM (NEEDLE) IMPLANT
NEEDLE PERC 18GX7CM (NEEDLE) ×2 IMPLANT
PACK CARDIAC CATH (CUSTOM PROCEDURE TRAY) ×2 IMPLANT
SHEATH AVANTI 7FRX11 (SHEATH) ×1 IMPLANT
SHEATH INTRODUCER MICRA (SHEATH) IMPLANT
SUT SILK 0 FSL (SUTURE) ×1 IMPLANT
SYSTEM PACING TRNSCTH AV MICRA (Pacemaker) IMPLANT
WIRE AMPLATZ SS-J .035X180CM (WIRE) ×1 IMPLANT

## 2021-07-07 NOTE — Progress Notes (Addendum)
NAME:  Alexander Webster, MRN:  [id number], DOB:  1928-01-08, LOS: 1 ADMISSION DATE:  07/06/2021, CONSULTATION DATE:  07/07/21  REFERRING MD:  Erma Heritage, CHIEF COMPLAINT:  Heart block   History of Present Illness:  87 y.o. male with h/o syncope, CAD on Plavix and CKD admitted with complete heart block. Had near syncopal episode at home and was found to be in complete heart block with profound hypotension and heart rate in the 30s on EMS arrival. He was started on external pacing en route with improvement in blood pressure and heart rate. He was subsequently intubated with 7.5 ETT in the ED due to high sedation requirement to tolerate external pacing. Dr. Darrold Junker was consulted and plans for temporary pacemaker placement.  Pertinent  Medical History  CAD Recurrent syncope CKD Undocumented COPD Allergic rhinitis  Significant Hospital Events: Including procedures, antibiotic start and stop dates in addition to other pertinent events   8/21: Admitted with heart block, intubated, temp pacer placed; successfully extubated  08/22: s/p permanent pacemaker placement   Interim History / Subjective:  N/A  Objective   Blood pressure 96/76, pulse 68, temperature 98.6 F (37 C), temperature source Bladder, resp. rate 14, height 5\' 7"  (1.702 m), weight 84.3 kg, SpO2 94 %.    Vent Mode: PSV;CPAP FiO2 (%):  [28 %] 28 % PEEP:  [5 cmH20] 5 cmH20 Pressure Support:  [5 cmH20] 5 cmH20   Intake/Output Summary (Last 24 hours) at 07/07/2021 1119 Last data filed at 07/07/2021 0400 Gross per 24 hour  Intake 130 ml  Output 2200 ml  Net -2070 ml   Filed Weights   07/06/21 0900 07/06/21 1018 07/07/21 0459  Weight: 86.3 kg 84 kg 84.3 kg    Examination: General: well developed, well nourished elderly male resting in bed, NAD  HENT: [place]/AT, pupils equal, round and reactive Lungs: clear throughout, even, non labored  Cardiovascular: paced rhythm, no R/G, 2+ radial/1+ distal pulses, no edema present  Abdomen:  +BS x4, soft, non tender, non distended  Extremities: moves all extremities, normal bulk and tone Neuro: alert and oriented, follows commands  GU: Foley in place  Resolved Hospital Problem list     Assessment & Plan:  Intubated for sedation, airway protection~extubated 08/21 Cardiogenic shock 2/2 complete heart block~s/p permanent pacemaker placement 08/22 Undocumented COPD not in acute exacerbation CKD IV  - [facility] following - s/p Permanent pacemaker placement 07/07/2021 - Continue home bronchodilator therapy with LAMA/LABA/ICS - Continue plavix and simvastatin  - Echo pending  - Creatinine at baseline - Monitor UOP, renal fxn, lytes; renally dose meds  Best Practice (right click and "Reselect all SmartList Selections" daily)   Diet/type: Heart Healthy DVT prophylaxis: Subq lovenox GI prophylaxis: PPI Lines: N/A Foley: Will discontinue  Code Status:  full code Last date of multidisciplinary goals of care discussion [N/A]  Labs   CBC: Recent Labs  Lab 07/06/21 0807 07/07/21 0428  WBC 7.1 9.9  NEUTROABS 4.2  --   HGB 12.5* 12.5*  HCT 37.9* 38.0*  MCV 98.4 95.5  PLT 214 210    Basic Metabolic Panel: Recent Labs  Lab 07/06/21 0807 07/06/21 1037 07/07/21 0428  NA 139  --  137  K 4.8  --  4.2  CL 105  --  109  CO2 25  --  23  GLUCOSE 108*  --  106*  BUN 26*  --  20  CREATININE 1.64*  --  1.23  CALCIUM 8.2*  --  8.0*  MG  2.1  --  2.1  PHOS  --  2.2* 3.4   GFR: Estimated Creatinine Clearance: 41.4 mL/min (by C-G formula based on SCr of 1.23 mg/dL). Recent Labs  Lab 07/06/21 0807 07/07/21 0428  WBC 7.1 9.9    Liver Function Tests: Recent Labs  Lab 07/06/21 0807  AST 18  ALT 10  ALKPHOS 43  BILITOT 0.6  PROT 6.1*  ALBUMIN 3.4*   No results for input(s): LIPASE, AMYLASE in the last 168 hours. No results for input(s): AMMONIA in the last 168 hours.  ABG    Component Value Date/Time   PHART 7.43 07/06/2021 0807   PCO2ART 33  07/06/2021 0807   PO2ART 81 (L) 07/06/2021 0807   HCO3 21.9 07/06/2021 0807   ACIDBASEDEF 1.8 07/06/2021 0807   O2SAT 96.2 07/06/2021 0807     Coagulation Profile: Recent Labs  Lab 07/06/21 1037  INR 1.0    Cardiac Enzymes: No results for input(s): CKTOTAL, CKMB, CKMBINDEX, TROPONINI in the last 168 hours.  HbA1C: Hgb A1c MFr Bld  Date/Time Value Ref Range Status  07/06/2021 10:37 AM 6.0 (H) 4.8 - 5.6 % Final    Comment:    (NOTE) Pre diabetes:          5.7%-6.4%  Diabetes:              >6.4%  Glycemic control for   <7.0% adults with diabetes     CBG: Recent Labs  Lab 07/06/21 1010 07/06/21 1525  GLUCAP 96 84    Review of Systems:   Unable to assess due to patient status.  Past Medical History:  He,  has a past medical history of Dysrhythmia, Leg fracture, and Syncope.   Surgical History:   Past Surgical History:  Procedure Laterality Date   HERNIA REPAIR Bilateral 05-24-12   Dr Evette Cristal   LEG SURGERY Right 02/08/2015   LOOP RECORDER INSERTION N/A 10/30/2020   Procedure: LOOP RECORDER INSERTION;  Surgeon: Marcina Millard, MD;  Location: [facility] INVASIVE CV LAB;  Service: Cardiovascular;  Laterality: N/A;   MUSCLE REPAIR Right    calf   ruptured disc     ruptured disc repair   TEMPORARY PACEMAKER N/A 07/06/2021   Procedure: TEMPORARY PACEMAKER;  Surgeon: Marcina Millard, MD;  Location: [facility] INVASIVE CV LAB;  Service: Cardiovascular;  Laterality: N/A;     Social History:   reports that he quit smoking about 26 years ago. His smoking use included cigarettes. He has a 20.00 pack-year smoking history. He has never used smokeless tobacco. He reports that he does not drink alcohol and does not use drugs.   Family History:  His family history includes Cancer in his father and mother; Heart disease in his mother.   Allergies Allergies  Allergen Reactions   Percocet [Oxycodone-Acetaminophen] Itching    Itching   Vicodin [Hydrocodone-Acetaminophen]  Itching     Home Medications  Prior to Admission medications   Medication Sig Start Date End Date Taking? Authorizing Provider  clopidogrel (PLAVIX) 75 MG tablet Take 75 mg by mouth at bedtime. 08/15/20   [provider]  metoprolol succinate (TOPROL-XL) 25 MG 24 hr tablet Take 1 tablet by mouth daily. 02/04/21   [provider]  Misc Natural Products (ADV TURMERIC CURCUMIN COMPLEX PO) Take 1 capsule by mouth in the morning and at bedtime. Primal Force Curcumin Triple Burn    [provider]  montelukast (SINGULAIR) 10 MG tablet Take 10 mg by mouth daily. 08/15/20   [provider]  pantoprazole (PROTONIX) 40 MG tablet Take 1 tablet (40 mg total) by mouth daily. 03/18/21   Enedina Finner, MD  simvastatin (ZOCOR) 20 MG tablet Take 20 mg by mouth at bedtime. 09/14/20   [provider]  TRELEGY ELLIPTA 100-62.5-25 MCG/INH AEPB Inhale 1 puff into the lungs daily. 05/21/21   [provider]     Critical care time: 30 minutes     Camie Patience, AGNP  Pulmonary/Critical Care Pager [phone] (please enter 7 digits) PCCM Consult Pager [phone] (please enter 7 digits)

## 2021-07-07 NOTE — Progress Notes (Signed)
1045 Received patient from Cath. Lab. Alert, oriented and pleasant. Patient was flat until 1330 then ate a late lunch. !530 Up to [facility] for large BM. Then walked to chair- a little unsteady in his gait but made it to chair. Presently sleeping in chair. Remains on room air and ventricular  paced at rate of 70.

## 2021-07-07 NOTE — Progress Notes (Signed)
PHARMACIST - PHYSICIAN COMMUNICATION  CONCERNING:  Enoxaparin (Lovenox) for DVT Prophylaxis    RECOMMENDATION: Patient was prescribed enoxaparin 30mg  q24 hours for VTE prophylaxis.   Filed Weights   07/06/21 0900 07/06/21 1018 07/07/21 0459  Weight: 86.3 kg (190 lb 4.1 oz) 84 kg (185 lb 3 oz) 84.3 kg (185 lb 13.6 oz)    Body mass index is 29.11 kg/m.  Estimated Creatinine Clearance: 41.4 mL/min (by C-G formula based on SCr of 1.23 mg/dL).   Patient is candidate for enoxaparin 40mg  every 24 hours based on CrCl >74ml/min and Weight >45kg  DESCRIPTION: Pharmacy has adjusted enoxaparin dose per [facility] policy.  Patient is now receiving enoxaparin 40 mg every 24 hours   31m 07/07/2021 10:55 AM

## 2021-07-08 DIAGNOSIS — K219 Gastro-esophageal reflux disease without esophagitis: Secondary | ICD-10-CM

## 2021-07-08 DIAGNOSIS — J449 Chronic obstructive pulmonary disease, unspecified: Secondary | ICD-10-CM

## 2021-07-08 DIAGNOSIS — I2583 Coronary atherosclerosis due to lipid rich plaque: Secondary | ICD-10-CM

## 2021-07-08 DIAGNOSIS — R570 Cardiogenic shock: Secondary | ICD-10-CM

## 2021-07-08 DIAGNOSIS — I442 Atrioventricular block, complete: Secondary | ICD-10-CM

## 2021-07-08 DIAGNOSIS — N1832 Chronic kidney disease, stage 3b: Secondary | ICD-10-CM | POA: Diagnosis not present

## 2021-07-08 DIAGNOSIS — I2510 Atherosclerotic heart disease of native coronary artery without angina pectoris: Secondary | ICD-10-CM

## 2021-07-08 LAB — BASIC METABOLIC PANEL
Anion gap: 7 (ref 5–15)
BUN: 23 mg/dL (ref 8–23)
CO2: 25 mmol/L (ref 22–32)
Calcium: 8.5 mg/dL — ABNORMAL LOW (ref 8.9–10.3)
Chloride: 106 mmol/L (ref 98–111)
Creatinine, Ser: 1.4 mg/dL — ABNORMAL HIGH (ref 0.61–1.24)
GFR, Estimated: 48 mL/min — ABNORMAL LOW (ref >60)
Glucose, Bld: 112 mg/dL — ABNORMAL HIGH (ref 70–99)
Potassium: 4 mmol/L (ref 3.5–5.1)
Sodium: 138 mmol/L (ref 135–145)

## 2021-07-08 LAB — ECHOCARDIOGRAM COMPLETE BUBBLE STUDY
AR max vel: 2.56 cm2
AV Peak grad: 7.6 mmHg
Ao pk vel: 1.38 m/s
Area-P 1/2: 2.63 cm2
S' Lateral: 2.38 cm

## 2021-07-08 LAB — CBC
HCT: 36.4 % — ABNORMAL LOW (ref 39.0–52.0)
Hemoglobin: 12.1 g/dL — ABNORMAL LOW (ref 13.0–17.0)
MCH: 31.3 pg (ref 26.0–34.0)
MCHC: 33.2 g/dL (ref 30.0–36.0)
MCV: 94.3 fL (ref 80.0–100.0)
Platelets: 199 10*3/uL (ref 150–400)
RBC: 3.86 MIL/uL — ABNORMAL LOW (ref 4.22–5.81)
RDW: 14.1 % (ref 11.5–15.5)
WBC: 8.6 10*3/uL (ref 4.0–10.5)
nRBC: 0 % (ref 0.0–0.2)

## 2021-07-08 LAB — MAGNESIUM: Magnesium: 1.9 mg/dL (ref 1.7–2.4)

## 2021-07-08 LAB — PHOSPHORUS: Phosphorus: 3.1 mg/dL (ref 2.5–4.6)

## 2021-07-08 LAB — GLUCOSE, CAPILLARY: Glucose-Capillary: 102 mg/dL — ABNORMAL HIGH (ref 70–99)

## 2021-07-08 MED ORDER — SIMVASTATIN 20 MG PO TABS: 20.0000 mg | ORAL_TABLET | Freq: Every day | ORAL | Status: DC

## 2021-07-08 MED ORDER — MONTELUKAST SODIUM 10 MG PO TABS: 10.0000 mg | ORAL_TABLET | Freq: Every day | ORAL | Status: DC

## 2021-07-08 MED ORDER — ACETAMINOPHEN 325 MG PO TABS: 650.0000 mg | ORAL_TABLET | ORAL | Status: DC | PRN

## 2021-07-08 MED ORDER — POLYETHYLENE GLYCOL 3350 17 G PO PACK: 17.0000 g | PACK | Freq: Every day | ORAL | Status: DC | PRN

## 2021-07-08 MED ORDER — PANTOPRAZOLE SODIUM 40 MG PO PACK
40.0000 mg | PACK | Freq: Every day | ORAL | Status: DC
  Administered 2021-07-08: 40 mg via ORAL
  Filled 2021-07-08: qty 20

## 2021-07-08 MED ORDER — CLOPIDOGREL BISULFATE 75 MG PO TABS: 75.0000 mg | ORAL_TABLET | Freq: Every day | ORAL | Status: DC

## 2021-07-08 MED ORDER — DOCUSATE SODIUM 50 MG/5ML PO LIQD: 100.0000 mg | Freq: Two times a day (BID) | ORAL | Status: DC | PRN

## 2021-07-08 NOTE — Progress Notes (Signed)
1210 Discharged home with wife. Discharge teaching done and questions answered prior to discharge.

## 2021-07-08 NOTE — Discharge Summary (Signed)
[facility] [place] [facility]   PATIENT NAME: Alexander Webster    MR#:  [id number]  DATE OF BIRTH:  04-Jan-1928  DATE OF ADMISSION:  07/06/2021 ADMITTING PHYSICIAN: Marcelo Baldy, MD  DATE OF DISCHARGE: 07/08/2021 12:44 PM  PRIMARY CARE PHYSICIAN: Alan Mulder, MD    ADMISSION DIAGNOSIS:  Complete heart block (HCC) [I44.2] Symptomatic bradycardia [R00.1]  DISCHARGE DIAGNOSIS:  Active Problems:   Symptomatic bradycardia   SECONDARY DIAGNOSIS:   Past Medical History:  Diagnosis Date   Dysrhythmia    possible cause of syncope-    Leg fracture    Syncope     HOSPITAL COURSE:   1.  Complete heart block, cardiogenic shock.  Patient required emergent temporary transvenous pacemaker on presentation and then placement of permanent pacemaker by Dr. Evette Georges on 07/07/2021 2.  Intubation was done secondary to airway protection for pacemaker placement on 8/21.  Patient was extubated 8/21 after temporary pacer. 3.  COPD.  Patient received nebulizer treatments here on Trelegy inhaler at home. 4.  Chronic kidney disease stage IIIa.  Creatinine 1.4 on discharge. 5.  History of CAD on Plavix and simvastatin 6.  GERD on Protonix  DISCHARGE CONDITIONS:   Satisfactory  CONSULTS OBTAINED:  Treatment Team:  Marcina Millard, MD  DRUG ALLERGIES:   Allergies  Allergen Reactions   Percocet [Oxycodone-Acetaminophen] Itching    Itching   Vicodin [Hydrocodone-Acetaminophen] Itching    DISCHARGE MEDICATIONS:   Allergies as of 07/08/2021       Reactions   Percocet [oxycodone-acetaminophen] Itching   Itching   Vicodin [hydrocodone-acetaminophen] Itching        Medication List     STOP taking these medications    metoprolol succinate 25 MG 24 hr tablet Commonly known as: TOPROL-XL       TAKE these medications    ADV TURMERIC CURCUMIN COMPLEX PO Take 1 capsule by mouth in the morning and at bedtime. Primal Force Curcumin Triple Burn    clopidogrel 75 MG tablet Commonly known as: PLAVIX Take 75 mg by mouth at bedtime.   montelukast 10 MG tablet Commonly known as: SINGULAIR Take 10 mg by mouth daily.   pantoprazole 40 MG tablet Commonly known as: PROTONIX Take 1 tablet (40 mg total) by mouth daily.   simvastatin 20 MG tablet Commonly known as: ZOCOR Take 20 mg by mouth at bedtime.   Trelegy Ellipta 100-62.5-25 MCG/INH Aepb Generic drug: Fluticasone-Umeclidin-Vilant Inhale 1 puff into the lungs daily.         DISCHARGE INSTRUCTIONS:   Follow-up PCP 5 days Follow-up cardiology Dr. Lady Gary 1 to 2-week  If you experience worsening of your admission symptoms, develop shortness of breath, life threatening emergency, suicidal or homicidal thoughts you must seek medical attention immediately by calling 911 or calling your MD immediately  if symptoms less severe.  You Must read complete instructions/literature along with all the possible adverse reactions/side effects for all the Medicines you take and that have been prescribed to you. Take any new Medicines after you have completely understood and accept all the possible adverse reactions/side effects.   Please note  You were cared for by a hospitalist during your hospital stay. If you have any questions about your discharge medications or the care you received while you were in the hospital after you are discharged, you can call the unit and asked to speak with the hospitalist on call if the hospitalist that took care of you is not available. Once you are  discharged, your primary care physician will handle any further medical issues. Please note that NO REFILLS for any discharge medications will be authorized once you are discharged, as it is imperative that you return to your primary care physician (or establish a relationship with a primary care physician if you do not have one) for your aftercare needs so that they can reassess your need for medications and monitor  your lab values.    Today   CHIEF COMPLAINT:   Chief Complaint  Patient presents with   Bradycardia    HISTORY OF PRESENT ILLNESS:  Alexander Webster  is a 87 y.o. male came in with bradycardia and found to be in complete heart block   VITAL SIGNS:  Blood pressure 101/70, pulse (!) 51, temperature 98.6 F (37 C), resp. rate 17, height 5\' 7"  (1.702 m), weight 84.3 kg, SpO2 96 %.  I/O:   Intake/Output Summary (Last 24 hours) at 07/08/2021 1651 Last data filed at 07/08/2021 0600 Gross per 24 hour  Intake 120 ml  Output 525 ml  Net -405 ml    PHYSICAL EXAMINATION:  GENERAL:  87 y.o.-year-old patient lying in the bed with no acute distress.  EYES: Pupils equal, round, reactive to light and accommodation. No scleral icterus. Extraocular muscles intact.  HEENT: Head atraumatic, normocephalic. Oropharynx and nasopharynx clear.  LUNGS: Normal breath sounds bilaterally, no wheezing, rales,rhonchi or crepitation. No use of accessory muscles of respiration.  CARDIOVASCULAR: S1, S2 normal. No murmurs, rubs, or gallops.  ABDOMEN: Soft, non-tender, non-distended. Bowel sounds present. No organomegaly or mass.  EXTREMITIES: No pedal edema.  NEUROLOGIC: Cranial nerves II through XII are intact. Muscle strength 5/5 in all extremities. Sensation intact. Gait not checked.  PSYCHIATRIC: The patient is alert and oriented x 3.  SKIN: Bruising right face  DATA REVIEW:   CBC Recent Labs  Lab 07/08/21 0550  WBC 8.6  HGB 12.1*  HCT 36.4*  PLT 199    Chemistries  Recent Labs  Lab 07/06/21 0807 07/07/21 0428 07/08/21 0550  NA 139   < > 138  K 4.8   < > 4.0  CL 105   < > 106  CO2 25   < > 25  GLUCOSE 108*   < > 112*  BUN 26*   < > 23  CREATININE 1.64*   < > 1.40*  CALCIUM 8.2*   < > 8.5*  MG 2.1   < > 1.9  AST 18  --   --   ALT 10  --   --   ALKPHOS 43  --   --   BILITOT 0.6  --   --    < > = values in this interval not displayed.    Microbiology Results  Results for orders  placed or performed during the hospital encounter of 07/06/21  Resp Panel by RT-PCR (Flu A&B, Covid) Nasopharyngeal Swab     Status: None   Collection Time: 07/06/21  8:07 AM   Specimen: Nasopharyngeal Swab; Nasopharyngeal(NP) swabs in vial transport medium  Result Value Ref Range Status   SARS Coronavirus 2 by RT PCR NEGATIVE NEGATIVE Final    Comment: (NOTE) SARS-CoV-2 target nucleic acids are NOT DETECTED.  The SARS-CoV-2 RNA is generally detectable in upper respiratory specimens during the acute phase of infection. The lowest concentration of SARS-CoV-2 viral copies this assay can detect is 138 copies/mL. A negative result does not preclude SARS-Cov-2 infection and should not be used as the sole basis for treatment or  other patient management decisions. A negative result may occur with  improper specimen collection/handling, submission of specimen other than nasopharyngeal swab, presence of viral mutation(s) within the areas targeted by this assay, and inadequate number of viral copies(<138 copies/mL). A negative result must be combined with clinical observations, patient history, and epidemiological information. The expected result is Negative.  Fact Sheet for Patients:  BloggerCourse.com  Fact Sheet for Healthcare Providers:  SeriousBroker.it  This test is no t yet approved or cleared by the [place] FDA and  has been authorized for detection and/or diagnosis of SARS-CoV-2 by FDA under an Emergency Use Authorization (EUA). This EUA will remain  in effect (meaning this test can be used) for the duration of the COVID-19 declaration under Section 564(b)(1) of the Act, 21 U.S.C.section 360bbb-3(b)(1), unless the authorization is terminated  or revoked sooner.       Influenza A by PCR NEGATIVE NEGATIVE Final   Influenza B by PCR NEGATIVE NEGATIVE Final    Comment: (NOTE) The Xpert Xpress SARS-CoV-2/FLU/RSV plus assay is  intended as an aid in the diagnosis of influenza from Nasopharyngeal swab specimens and should not be used as a sole basis for treatment. Nasal washings and aspirates are unacceptable for Xpert Xpress SARS-CoV-2/FLU/RSV testing.  Fact Sheet for Patients: BloggerCourse.com  Fact Sheet for Healthcare Providers: SeriousBroker.it  This test is not yet approved or cleared by the [place] FDA and has been authorized for detection and/or diagnosis of SARS-CoV-2 by FDA under an Emergency Use Authorization (EUA). This EUA will remain in effect (meaning this test can be used) for the duration of the COVID-19 declaration under Section 564(b)(1) of the Act, 21 U.S.C. section 360bbb-3(b)(1), unless the authorization is terminated or revoked.  Performed at [facility], [address] [place], [place] [address]     RADIOLOGY:  EP PPM/ICD IMPLANT  Result Date: 07/07/2021 Successful Medtronic Micra AV leadless pacemaker implantation     Management plans discussed with the patient, and he is in agreement.  Patient declined me calling family  CODE STATUS:     Code Status Orders  (From admission, onward)           Start     Ordered   07/06/21 1135  Full code  Continuous        07/06/21 1134           Code Status History     Date Active Date Inactive Code Status Order ID Comments User Context   03/17/2021 1223 03/18/2021 1832 Full Code [id number]  Lucile Shutters, MD ED       TOTAL TIME TAKING CARE OF THIS PATIENT: 31 minutes.    Alford Highland M.D on 07/08/2021 at 4:51 PM  [facility]  CC: Primary care physician; Alan Mulder, MD

## 2021-07-08 NOTE — Progress Notes (Addendum)
[facility] Cardiology  SUBJECTIVE: Patient laying in bed, reports feeling well, denies chest pain, shortness of breath, palpitations, presyncope or syncope   Vitals:   07/08/21 0300 07/08/21 0400 07/08/21 0500 07/08/21 0600  BP: 118/67 97/62 104/61 109/60  Pulse: 78 67 70 67  Resp: (!) 33 (!) 24 (!) 21 (!) 21  Temp: 98.4 F (36.9 C)     TempSrc: Oral     SpO2: 97% 93% 92% 94%  Weight:      Height:         Intake/Output Summary (Last 24 hours) at 07/08/2021 0737 Last data filed at 07/08/2021 0600 Gross per 24 hour  Intake 480 ml  Output 675 ml  Net -195 ml      PHYSICAL EXAM  General: Well developed, well nourished, in no acute distress HEENT:  Normocephalic and atramatic Neck:  No JVD.  Lungs: Clear bilaterally to auscultation and percussion. Heart: HRRR . Normal S1 and S2 without gallops or murmurs.  Abdomen: Bowel sounds are positive, abdomen soft and non-tender  Msk:  Back normal, normal gait. Normal strength and tone for age. Extremities: No clubbing, cyanosis or edema.   Neuro: Alert and oriented X 3. Psych:  Good affect, responds appropriately   LABS: Basic Metabolic Panel: Recent Labs    07/07/21 0428 07/08/21 0550  NA 137 138  K 4.2 4.0  CL 109 106  CO2 23 25  GLUCOSE 106* 112*  BUN 20 23  CREATININE 1.23 1.40*  CALCIUM 8.0* 8.5*  MG 2.1 1.9  PHOS 3.4 3.1   Liver Function Tests: Recent Labs    07/06/21 0807  AST 18  ALT 10  ALKPHOS 43  BILITOT 0.6  PROT 6.1*  ALBUMIN 3.4*   No results for input(s): LIPASE, AMYLASE in the last 72 hours. CBC: Recent Labs    07/06/21 0807 07/07/21 0428 07/08/21 0550  WBC 7.1 9.9 8.6  NEUTROABS 4.2  --   --   HGB 12.5* 12.5* 12.1*  HCT 37.9* 38.0* 36.4*  MCV 98.4 95.5 94.3  PLT 214 210 199   Cardiac Enzymes: No results for input(s): CKTOTAL, CKMB, CKMBINDEX, TROPONINI in the last 72 hours. BNP: Invalid input(s): POCBNP D-Dimer: No results for input(s): DDIMER in the last 72 hours. Hemoglobin  A1C: Recent Labs    07/06/21 1037  HGBA1C 6.0*   Fasting Lipid Panel: Recent Labs    07/06/21 1037  CHOL 128  HDL 39*  LDLCALC 62  TRIG 106  CHOLHDL 3.3   Thyroid Function Tests: Recent Labs    07/06/21 0807  TSH 6.734*   Anemia Panel: No results for input(s): VITAMINB12, FOLATE, FERRITIN, TIBC, IRON, RETICCTPCT in the last 72 hours.  CARDIAC CATHETERIZATION  Result Date: 07/07/2021 Successful temporary transvenous pacemaker via right internal jugular vein   EP PPM/ICD IMPLANT  Result Date: 07/07/2021 Successful Medtronic Micra AV leadless pacemaker implantation   DG Chest Portable 1 View  Result Date: 07/06/2021 CLINICAL DATA:  Intubation and orogastric tube placement EXAM: PORTABLE CHEST 1 VIEW COMPARISON:  03/17/2021 FINDINGS: Endotracheal tube terminates 2.8 cm above carina. Overlying pacer/defibrillator. Midline trachea. Normal heart size for level of inspiration. Atherosclerosis in the transverse aorta. No pleural effusion or pneumothorax. No congestive failure. Possible right infrahilar airspace disease. Lower left chest not well evaluated secondary to pacer/defibrillator. IMPRESSION: Appropriate position of endotracheal tube. Suspicion of right infrahilar atelectasis or early infection/aspiration. Aortic Atherosclerosis (ICD10-I70.0). Electronically Signed   By: Jeronimo Greaves M.D.   On: 07/06/2021 08:57   DG  Abd Portable 1 View  Result Date: 07/06/2021 CLINICAL DATA:  Orogastric tube placement EXAM: PORTABLE ABDOMEN - 1 VIEW COMPARISON:  Chest radiograph of earlier today FINDINGS: Numerous leads and wires project over the chest. Nasogastric tube terminates in the proximal stomach with the side port at the level of the gastroesophageal junction. Nonobstructive bowel gas pattern. Vascular calcifications. IMPRESSION: Nasogastric tube terminating at the proximal stomach with side port at the level of the gastroesophageal junction. Consider advancement 5-10 cm. Electronically  Signed   By: Jeronimo Greaves M.D.   On: 07/06/2021 09:37     Echo   TELEMETRY: Atrial sensing with ventricular pacing:  ASSESSMENT AND PLAN:  Active Problems:   Symptomatic bradycardia    1.  Complete heart block/high-grade AV block, initially hemodynamic instability requiring temporary transvenous pacemaker, underwent successful Micra AV leadless pacemaker 07/07/2021  Recommendations  1.  Ambulate today, discharge home, follow-up with Dr. Lady Gary 1 week   Marcina Millard, MD, PhD, [facility] 07/08/2021 7:52 AM

## 2021-07-08 NOTE — Discharge Instructions (Signed)
Patient may shower 07/10/2021.  Leave Tegaderm on until it falls off.

## 2021-07-18 ENCOUNTER — Encounter: Payer: Self-pay | Admitting: Cardiology

## 2022-12-03 ENCOUNTER — Other Ambulatory Visit: Payer: Self-pay

## 2022-12-03 ENCOUNTER — Emergency Department: Payer: Medicare Other

## 2022-12-03 ENCOUNTER — Emergency Department
Admission: EM | Admit: 2022-12-03 | Discharge: 2022-12-03 | Disposition: A | Discharge status: Home / Self Care | Payer: Medicare Other | Attending: Emergency Medicine | Admitting: Emergency Medicine

## 2022-12-03 DIAGNOSIS — N1832 Chronic kidney disease, stage 3b: Secondary | ICD-10-CM | POA: Insufficient documentation

## 2022-12-03 DIAGNOSIS — R42 Dizziness and giddiness: Secondary | ICD-10-CM | POA: Insufficient documentation

## 2022-12-03 DIAGNOSIS — Z950 Presence of cardiac pacemaker: Secondary | ICD-10-CM | POA: Diagnosis not present

## 2022-12-03 DIAGNOSIS — I2510 Atherosclerotic heart disease of native coronary artery without angina pectoris: Secondary | ICD-10-CM | POA: Insufficient documentation

## 2022-12-03 LAB — COMPREHENSIVE METABOLIC PANEL
ALT: 11 U/L (ref 0–44)
AST: 23 U/L (ref 15–41)
Albumin: 3.9 g/dL (ref 3.5–5.0)
Alkaline Phosphatase: 54 U/L (ref 38–126)
Anion gap: 11 (ref 5–15)
BUN: 42 mg/dL — ABNORMAL HIGH (ref 8–23)
CO2: 21 mmol/L — ABNORMAL LOW (ref 22–32)
Calcium: 9.1 mg/dL (ref 8.9–10.3)
Chloride: 103 mmol/L (ref 98–111)
Creatinine, Ser: 1.82 mg/dL — ABNORMAL HIGH (ref 0.61–1.24)
GFR, Estimated: 34 mL/min — ABNORMAL LOW (ref >60)
Glucose, Bld: 139 mg/dL — ABNORMAL HIGH (ref 70–99)
Potassium: 3.8 mmol/L (ref 3.5–5.1)
Sodium: 135 mmol/L (ref 135–145)
Total Bilirubin: 1 mg/dL (ref 0.3–1.2)
Total Protein: 7.5 g/dL (ref 6.5–8.1)

## 2022-12-03 LAB — CBC WITH DIFFERENTIAL/PLATELET
Abs Immature Granulocytes: 0.06 10*3/uL (ref 0.00–0.07)
Basophils Absolute: 0.1 10*3/uL (ref 0.0–0.1)
Basophils Relative: 1 %
Eosinophils Absolute: 0.1 10*3/uL (ref 0.0–0.5)
Eosinophils Relative: 1 %
HCT: 45.1 % (ref 39.0–52.0)
Hemoglobin: 14.5 g/dL (ref 13.0–17.0)
Immature Granulocytes: 1 %
Lymphocytes Relative: 21 %
Lymphs Abs: 2.2 10*3/uL (ref 0.7–4.0)
MCH: 30.4 pg (ref 26.0–34.0)
MCHC: 32.2 g/dL (ref 30.0–36.0)
MCV: 94.5 fL (ref 80.0–100.0)
Monocytes Absolute: 0.6 10*3/uL (ref 0.1–1.0)
Monocytes Relative: 6 %
Neutro Abs: 7.4 10*3/uL (ref 1.7–7.7)
Neutrophils Relative %: 70 %
Platelets: 274 10*3/uL (ref 150–400)
RBC: 4.77 MIL/uL (ref 4.22–5.81)
RDW: 14.5 % (ref 11.5–15.5)
WBC: 10.4 10*3/uL (ref 4.0–10.5)
nRBC: 0 % (ref 0.0–0.2)

## 2022-12-03 MED ORDER — LACTATED RINGERS IV BOLUS
1000.0000 mL | Freq: Once | INTRAVENOUS | Status: AC
  Administered 2022-12-03: 1000 mL via INTRAVENOUS

## 2022-12-03 MED ORDER — MECLIZINE HCL 25 MG PO TABS
25.0000 mg | ORAL_TABLET | Freq: Once | ORAL | Status: AC
  Administered 2022-12-03: 25 mg via ORAL
  Filled 2022-12-03: qty 1

## 2022-12-03 NOTE — ED Notes (Signed)
Pt on bedpan.

## 2022-12-03 NOTE — ED Provider Notes (Signed)
[facility] Provider Note    Event Date/Time   First MD Initiated Contact with Patient 12/03/22 1248     (approximate)   History   Dizziness   HPI  Alexander Webster is a 89 y.o. male goal history of complete heart block status post pacemaker who presents because of feeling like he is "going to take off".  Patient says he was in his normal state of health was helping his wife this morning when he all of a sudden felt really unwell.  He has trouble describing the nature of his symptoms but says that he feels like he needs to hang onto that he does not lift off.  Does endorse feeling a sensation of movement.  Feels better when he shuts his eyes.  Seems to come and go and does have periods where he is asymptomatic.  He was able to ambulate through it.  Denies any diplopia numbness tingling or weakness.  Denies headache.  Denies chest pain or shortness of breath.  Patient tells me he has had an episode of vertigo in the past that was short-lived.  Denies tinnitus or hearing loss.     Past Medical History:  Diagnosis Date   Dysrhythmia    possible cause of syncope-    Leg fracture    Syncope     Patient Active Problem List   Diagnosis Date Noted   Complete heart block (HCC)    Cardiogenic shock (HCC)    Chronic obstructive pulmonary disease (HCC)    Stage 3b chronic kidney disease ([place])    Coronary artery disease due to lipid rich plaque    Symptomatic bradycardia 07/06/2021   Gastroesophageal reflux disease    Aspiration pneumonia (HCC) 03/17/2021   Acute respiratory failure (HCC) 03/17/2021   Cough 08/28/2014   Vasomotor rhinitis 08/28/2014     Physical Exam  Triage Vital Signs: ED Triage Vitals  Enc Vitals Group     BP 12/03/22 1253 (!) 93/51     Pulse Rate 12/03/22 1253 76     Resp 12/03/22 1253 12     Temp 12/03/22 1548 98 F (36.7 C)     Temp Source 12/03/22 1253 Oral     SpO2 12/03/22 1253 96 %     Weight 12/03/22 1302 185 lb 13.6 oz (84.3  kg)     Height 12/03/22 1302 5\' 7"  (1.702 m)     Head Circumference --      Peak Flow --      Pain Score 12/03/22 1301 0     Pain Loc --      Pain Edu? --      Excl. in [place]? --     Most recent vital signs: Vitals:   12/03/22 1545 12/03/22 1548  BP: (!) 143/82   Pulse: 77   Resp: 18   Temp:  98 F (36.7 C)  SpO2: 94%      General: Awake, no distress.  CV:  Good peripheral perfusion.  Resp:  Normal effort.  Abd:  No distention.  Neuro:             Awake, Alert, Oriented x 3  Other:  Patient resting comfortably but intermittently will grimace tells me during these episodes he is not in pain but is just having the sensation of movement Aox3, nml speech  PERRL, EOMI, face symmetric, nml tongue movement  No nystagmus 5/5 strength in the BL upper and lower extremities  Sensation grossly intact in the  BL upper and lower extremities  Finger-nose-finger intact BL Pt able to ambulate, no ataxia    ED Results / Procedures / Treatments  Labs (all labs ordered are listed, but only abnormal results are displayed) Labs Reviewed  COMPREHENSIVE METABOLIC PANEL - Abnormal; Notable for the following components:      Result Value   CO2 21 (*)    Glucose, Bld 139 (*)    BUN 42 (*)    Creatinine, Ser 1.82 (*)    GFR, Estimated 34 (*)    All other components within normal limits  CBC WITH DIFFERENTIAL/PLATELET     EKG  EKG shows sinus rhythm with left axis deviation and intraventricular conduction delay, no Sgarbossa criteria to suggest acute ischemia   RADIOLOGY I reviewed and interpreted the CT scan of the brain which does not show any acute intracranial process    PROCEDURES:  Critical Care performed: No  .1-3 Lead EKG Interpretation  Performed by: Georga Hacking, MD Authorized by: Georga Hacking, MD     Interpretation: normal     ECG rate assessment: normal     Rhythm: sinus rhythm     Ectopy: none     Conduction: normal     The patient is on the  cardiac monitor to evaluate for evidence of arrhythmia and/or significant heart rate changes.   MEDICATIONS ORDERED IN ED: Medications  lactated ringers bolus 1,000 mL (0 mLs Intravenous Stopped 12/03/22 1437)  meclizine (ANTIVERT) tablet 25 mg (25 mg Oral Given 12/03/22 1437)     IMPRESSION / MDM / ASSESSMENT AND PLAN / ED COURSE  I reviewed the triage vital signs and the nursing notes.                              Patient's presentation is most consistent with acute complicated illness / injury requiring diagnostic workup.  Differential diagnosis includes, but is not limited to, peripheral vertigo including DVT, vestibular neuritis, central vertigo including posterior stroke, less likely syncope  Patient is a 89 year old male presenting with dizziness although he does not describe it like this necessarily.  Came on rather acutely.  He when I am evaluating him is sitting back in bed and intermittently grimaces and shuts his eyes when I asked him when he is feeling fatigued not having pain but feels like he is going to "lift off and needs to hold himself so he does not move.  When I asked him if he feels like he is bending he does endorse sensation of movement.  However denies headache diplopia numbness tingling or weakness.  Nodes are coming and going and he is having periods when he is not moving that he is asymptomatic.  Does have history of this in the past.  Denies chest pain shortness of breath or syncope.  On neurologic exam his neurologic exam is reassuring there is no nystagmus including no vertical nystagmus normal finger-to-nose he is not ataxic.  I did obtain labs CT head and will trial meclizine.  EKG showing intraventricular conduction delay but sinus rhythm.  Patient's labs are reassuring.  CT head negative for acute abnormality.  On reassessment patient is essentially asymptomatic.  I think that his clinical presentation is most consistent with BPPV.  Given normal neurologic exam  symptoms have resolved do not think that he needs further workup for stroke today.  Did advise patient that if he has any  FINAL CLINICAL IMPRESSION(S) / ED DIAGNOSES   Final diagnoses:  Vertigo     Rx / DC Orders   ED Discharge Orders     None        Note:  This document was prepared using Dragon voice recognition software and may include unintentional dictation errors.   Rada Hay, MD 12/03/22 [phone]

## 2022-12-03 NOTE — ED Triage Notes (Signed)
Patient comes in today via [facility] with complaints of dizziness. Patient states that he has coming and going sensations where he feels like he has to hold on so that he doesn't float away. When patient feels the sensation, he grimaces, and clenches his hands with his eyes closed.   Pt has a history of vertigo ,and has a pacemaker. Pt is alert and oriented x4.  EMS Vitals: Bp:137/68 HR: 89 Spo2: 90

## 2022-12-03 NOTE — Discharge Instructions (Addendum)
I suspect that your dizziness was from an inner ear problem.  Please follow-up with your primary doctor if you continue to have intermittent symptoms you may need to see an ear nose and throat doctor.  If you develop any numbness tingling weakness or unable to ambulate or develop double vision or difficulty speaking please return immediately to the emergency department.

## 2022-12-03 NOTE — ED Notes (Signed)
MD McHugh at bedside, preparing to ambulate pt

## 2022-12-10 MED FILL — Lactated Ringer's Solution: INTRAVENOUS | Qty: 1000 | Status: AC

## 2023-11-12 ENCOUNTER — Other Ambulatory Visit: Payer: Self-pay

## 2023-11-12 ENCOUNTER — Encounter: Payer: Self-pay | Admitting: Emergency Medicine

## 2023-11-12 ENCOUNTER — Emergency Department
Admission: EM | Admit: 2023-11-12 | Discharge: 2023-11-12 | Disposition: A | Discharge status: Home / Self Care | Payer: Medicare Other | Attending: Emergency Medicine | Admitting: Emergency Medicine

## 2023-11-12 DIAGNOSIS — N189 Chronic kidney disease, unspecified: Secondary | ICD-10-CM | POA: Insufficient documentation

## 2023-11-12 DIAGNOSIS — R42 Dizziness and giddiness: Secondary | ICD-10-CM | POA: Insufficient documentation

## 2023-11-12 DIAGNOSIS — Z950 Presence of cardiac pacemaker: Secondary | ICD-10-CM | POA: Diagnosis not present

## 2023-11-12 LAB — TROPONIN I (HIGH SENSITIVITY): Troponin I (High Sensitivity): 24 ng/L — ABNORMAL HIGH (ref <18)

## 2023-11-12 LAB — BASIC METABOLIC PANEL
Anion gap: 9 (ref 5–15)
BUN: 40 mg/dL — ABNORMAL HIGH (ref 8–23)
CO2: 23 mmol/L (ref 22–32)
Calcium: 8.6 mg/dL — ABNORMAL LOW (ref 8.9–10.3)
Chloride: 107 mmol/L (ref 98–111)
Creatinine, Ser: 2.11 mg/dL — ABNORMAL HIGH (ref 0.61–1.24)
GFR, Estimated: 29 mL/min — ABNORMAL LOW (ref >60)
Glucose, Bld: 107 mg/dL — ABNORMAL HIGH (ref 70–99)
Potassium: 4.1 mmol/L (ref 3.5–5.1)
Sodium: 139 mmol/L (ref 135–145)

## 2023-11-12 LAB — CBC
HCT: 42.6 % (ref 39.0–52.0)
Hemoglobin: 13.8 g/dL (ref 13.0–17.0)
MCH: 31.2 pg (ref 26.0–34.0)
MCHC: 32.4 g/dL (ref 30.0–36.0)
MCV: 96.4 fL (ref 80.0–100.0)
Platelets: 200 10*3/uL (ref 150–400)
RBC: 4.42 MIL/uL (ref 4.22–5.81)
RDW: 14.1 % (ref 11.5–15.5)
WBC: 7.6 10*3/uL (ref 4.0–10.5)
nRBC: 0 % (ref 0.0–0.2)

## 2023-11-12 NOTE — ED Provider Notes (Signed)
[facility] Provider Note    Event Date/Time   First MD Initiated Contact with Patient 11/12/23 1434     (approximate)   History   Dizziness   HPI Alexander Webster is a 89 y.o. male with history of heart block s/p pacemaker, CKD stage III presenting today for dizziness.  Patient states he has a 2-year history of intermittent dizzy/lightheaded episodes.  He was seeing his cardiology team today for follow-up and reassessment.  While there they found him to be orthostatic hypotensive.  They told him to come to the ED for further evaluation.  He denies any full syncopal episodes or passing out.  Dizziness is not constant and only happens when he stands up.  Denies chest pain, shortness of breath, numbness, weakness.  Does admit to drinking less recently that he normally does.  Otherwise no acute symptoms today.  Reviewed cardiology note for today noting patient's orthostatic hypotension.  Patient had prior history of syncopal episodes which resolved after pacemaker placement.  At his visit today they recommended evaluation in the ED with thing such as CTA head/neck and further laboratory workup.     Physical Exam   Triage Vital Signs: ED Triage Vitals  Encounter Vitals Group     BP 11/12/23 1134 98/64     Systolic BP Percentile --      Diastolic BP Percentile --      Pulse Rate 11/12/23 1134 71     Resp 11/12/23 1134 16     Temp 11/12/23 1134 97.9 F (36.6 C)     Temp Source 11/12/23 1134 Oral     SpO2 11/12/23 1134 95 %     Weight 11/12/23 1135 153 lb 10.6 oz (69.7 kg)     Height --      Head Circumference --      Peak Flow --      Pain Score 11/12/23 1134 0     Pain Loc --      Pain Education --      Exclude from Growth Chart --     Most recent vital signs: Vitals:   11/12/23 1342 11/12/23 1455  BP: (!) 111/56 118/64  Pulse: 67 68  Resp: 18 18  Temp: 98.2 F (36.8 C)   SpO2: 95% 95%   Physical Exam: I have reviewed the vital signs and nursing  notes. General: Awake, alert, no acute distress.  Nontoxic appearing. Head:  Atraumatic, normocephalic.   ENT:  EOM intact, PERRL. Oral mucosa is pink and moist with no lesions. Neck: Neck is supple with full range of motion, No meningeal signs. Cardiovascular:  RRR, No murmurs. Peripheral pulses palpable and equal bilaterally. Respiratory:  Symmetrical chest wall expansion.  No rhonchi, rales, or wheezes.  Good air movement throughout.  No use of accessory muscles.   Musculoskeletal:  No cyanosis or edema. Moving extremities with full ROM Abdomen:  Soft, nontender, nondistended. Neuro:  GCS 15, moving all four extremities, interacting appropriately. Speech clear.  No ataxia.  5 out of 5 strength in bilateral upper and lower extremities.  No obvious sensation deficits.  Cranial nerves II through XII intact.  No nystagmus. Psych:  Calm, appropriate.   Skin:  Warm, dry, no rash.    ED Results / Procedures / Treatments   Labs (all labs ordered are listed, but only abnormal results are displayed) Labs Reviewed  BASIC METABOLIC PANEL - Abnormal; Notable for the following components:      Result Value  Glucose, Bld 107 (*)    BUN 40 (*)    Creatinine, Ser 2.11 (*)    Calcium 8.6 (*)    GFR, Estimated 29 (*)    All other components within normal limits  TROPONIN I (HIGH SENSITIVITY) - Abnormal; Notable for the following components:   Troponin I (High Sensitivity) 24 (*)    All other components within normal limits  CBC  TROPONIN I (HIGH SENSITIVITY)     EKG My EKG interpretation: Rate of 64, ventricular paced rhythm.  No acute ST elevations or depressions   RADIOLOGY    PROCEDURES:  Critical Care performed: No  Procedures   MEDICATIONS ORDERED IN ED: Medications - No data to display   IMPRESSION / MDM / ASSESSMENT AND PLAN / ED COURSE  I reviewed the triage vital signs and the nursing notes.                              Differential diagnosis includes, but is not  limited to, orthostatic hypotension, venous insufficiency, dehydration, cardiac arrhythmia  Patient's presentation is most consistent with acute presentation with potential threat to life or bodily function.  Patient is a 89 year old male presenting today for intermittent dizziness and lightheaded episodes over the past 2 years.  On arrival here he denies any acute symptoms at this time.  Initial blood pressure was slightly low at 98/64 but repeats within normal limits.  No tachycardia.  No other neurological deficits present.  Laboratory workup shows slight elevation in his troponin compared to prior baseline but without any chest pain is likely lower suspicion for ACS.  BMP with slight AKI in comparison to recent labs on his baseline CKD stage III.  CBC otherwise unremarkable.  This workup was all collected while patient was waiting in the waiting room.  On arrival to the room, I discussed these findings and stated that I would like to get a repeat troponin as well as a CTA head and neck to rule out further concerning pathology.  I also wanted to give him 1 L of fluids for signs of dehydration.  Patient stated that he did not want to wait in the emergency department any longer.  He stated the symptoms have been present for 2 years with no change and was not worried about anything more concerning.  I emphasized my concerns of worsening symptoms potentially leading to a syncopal episode which could threaten his life.  He was understanding of this and had capacity make decision.  Patient signed out AMA.  I strongly encouraged him to call his cardiology and PCP office in the morning for follow-up visits and given strict return precautions.     FINAL CLINICAL IMPRESSION(S) / ED DIAGNOSES   Final diagnoses:  Dizziness     Rx / DC Orders   ED Discharge Orders     None        Note:  This document was prepared using Dragon voice recognition software and may include unintentional dictation errors.    Janith Lima, MD 11/12/23 [id number]

## 2023-11-12 NOTE — ED Notes (Signed)
See triage note  Presents with some dizziness  States he was at the cardiology office  States he saw the PA  Was sent here b/c of intermittent dizziness  Denies any complaints at present

## 2023-11-12 NOTE — ED Triage Notes (Signed)
Says dizziness--history of same.  Went to dr appt today and they sent him here.  Pateint says he feels fine now.  Says he can walk okay.

## 2023-11-12 NOTE — Discharge Instructions (Signed)
I would like for you to call your cardiology and primary care doctors tomorrow morning to get a follow-up visit.  Your heart team wanted you to have further evaluation for your dizziness which she left for today.  There appears to be signs of dehydration and this could be from that or issues with your heart which we are unable to fully evaluate today.  Please come back immediately if you have worsening symptoms.

## 2023-11-20 ENCOUNTER — Emergency Department: Payer: Medicare Other

## 2023-11-20 ENCOUNTER — Inpatient Hospital Stay
Admission: EM | Admit: 2023-11-20 | Discharge: 2023-11-24 | DRG: 312 | Disposition: A | Discharge status: Home Health | Payer: Medicare Other | Attending: Internal Medicine | Admitting: Internal Medicine

## 2023-11-20 ENCOUNTER — Other Ambulatory Visit: Payer: Self-pay

## 2023-11-20 DIAGNOSIS — Z950 Presence of cardiac pacemaker: Secondary | ICD-10-CM

## 2023-11-20 DIAGNOSIS — R55 Syncope and collapse: Secondary | ICD-10-CM | POA: Diagnosis not present

## 2023-11-20 DIAGNOSIS — I442 Atrioventricular block, complete: Secondary | ICD-10-CM | POA: Diagnosis present

## 2023-11-20 DIAGNOSIS — Z8249 Family history of ischemic heart disease and other diseases of the circulatory system: Secondary | ICD-10-CM

## 2023-11-20 DIAGNOSIS — E785 Hyperlipidemia, unspecified: Secondary | ICD-10-CM | POA: Diagnosis present

## 2023-11-20 DIAGNOSIS — Z885 Allergy status to narcotic agent status: Secondary | ICD-10-CM

## 2023-11-20 DIAGNOSIS — Z7951 Long term (current) use of inhaled steroids: Secondary | ICD-10-CM

## 2023-11-20 DIAGNOSIS — S2242XA Multiple fractures of ribs, left side, initial encounter for closed fracture: Secondary | ICD-10-CM | POA: Diagnosis present

## 2023-11-20 DIAGNOSIS — Z66 Do not resuscitate: Secondary | ICD-10-CM | POA: Diagnosis present

## 2023-11-20 DIAGNOSIS — W19XXXA Unspecified fall, initial encounter: Secondary | ICD-10-CM | POA: Diagnosis not present

## 2023-11-20 DIAGNOSIS — Z7902 Long term (current) use of antithrombotics/antiplatelets: Secondary | ICD-10-CM

## 2023-11-20 DIAGNOSIS — Z87891 Personal history of nicotine dependence: Secondary | ICD-10-CM

## 2023-11-20 DIAGNOSIS — J690 Pneumonitis due to inhalation of food and vomit: Secondary | ICD-10-CM | POA: Diagnosis not present

## 2023-11-20 DIAGNOSIS — D72829 Elevated white blood cell count, unspecified: Secondary | ICD-10-CM

## 2023-11-20 DIAGNOSIS — J439 Emphysema, unspecified: Secondary | ICD-10-CM

## 2023-11-20 DIAGNOSIS — N1832 Chronic kidney disease, stage 3b: Secondary | ICD-10-CM | POA: Diagnosis present

## 2023-11-20 DIAGNOSIS — I2510 Atherosclerotic heart disease of native coronary artery without angina pectoris: Secondary | ICD-10-CM | POA: Diagnosis present

## 2023-11-20 DIAGNOSIS — I700 Atherosclerosis of aorta: Secondary | ICD-10-CM | POA: Diagnosis present

## 2023-11-20 DIAGNOSIS — J449 Chronic obstructive pulmonary disease, unspecified: Secondary | ICD-10-CM | POA: Diagnosis present

## 2023-11-20 DIAGNOSIS — I951 Orthostatic hypotension: Secondary | ICD-10-CM | POA: Diagnosis not present

## 2023-11-20 DIAGNOSIS — Z79899 Other long term (current) drug therapy: Secondary | ICD-10-CM

## 2023-11-20 DIAGNOSIS — I5A Non-ischemic myocardial injury (non-traumatic): Secondary | ICD-10-CM | POA: Diagnosis present

## 2023-11-20 DIAGNOSIS — Y92009 Unspecified place in unspecified non-institutional (private) residence as the place of occurrence of the external cause: Secondary | ICD-10-CM

## 2023-11-20 DIAGNOSIS — S2232XA Fracture of one rib, left side, initial encounter for closed fracture: Secondary | ICD-10-CM | POA: Diagnosis present

## 2023-11-20 DIAGNOSIS — Z807 Family history of other malignant neoplasms of lymphoid, hematopoietic and related tissues: Secondary | ICD-10-CM

## 2023-11-20 DIAGNOSIS — Z8042 Family history of malignant neoplasm of prostate: Secondary | ICD-10-CM

## 2023-11-20 DIAGNOSIS — T17908A Unspecified foreign body in respiratory tract, part unspecified causing other injury, initial encounter: Secondary | ICD-10-CM | POA: Diagnosis present

## 2023-11-20 DIAGNOSIS — I5032 Chronic diastolic (congestive) heart failure: Secondary | ICD-10-CM | POA: Diagnosis present

## 2023-11-20 DIAGNOSIS — M25561 Pain in right knee: Secondary | ICD-10-CM | POA: Diagnosis present

## 2023-11-20 LAB — URINALYSIS, ROUTINE W REFLEX MICROSCOPIC
Bilirubin Urine: NEGATIVE
Glucose, UA: 50 mg/dL — AB
Ketones, ur: NEGATIVE mg/dL
Leukocytes,Ua: NEGATIVE
Nitrite: NEGATIVE
Protein, ur: 30 mg/dL — AB
Specific Gravity, Urine: 1.027 (ref 1.005–1.030)
pH: 5 (ref 5.0–8.0)

## 2023-11-20 LAB — CBC WITH DIFFERENTIAL/PLATELET
Abs Immature Granulocytes: 0.07 10*3/uL (ref 0.00–0.07)
Basophils Absolute: 0 10*3/uL (ref 0.0–0.1)
Basophils Relative: 0 %
Eosinophils Absolute: 0 10*3/uL (ref 0.0–0.5)
Eosinophils Relative: 0 %
HCT: 44.2 % (ref 39.0–52.0)
Hemoglobin: 14.5 g/dL (ref 13.0–17.0)
Immature Granulocytes: 1 %
Lymphocytes Relative: 7 %
Lymphs Abs: 1 10*3/uL (ref 0.7–4.0)
MCH: 31.5 pg (ref 26.0–34.0)
MCHC: 32.8 g/dL (ref 30.0–36.0)
MCV: 95.9 fL (ref 80.0–100.0)
Monocytes Absolute: 1.5 10*3/uL — ABNORMAL HIGH (ref 0.1–1.0)
Monocytes Relative: 11 %
Neutro Abs: 11.8 10*3/uL — ABNORMAL HIGH (ref 1.7–7.7)
Neutrophils Relative %: 81 %
Platelets: 207 10*3/uL (ref 150–400)
RBC: 4.61 MIL/uL (ref 4.22–5.81)
RDW: 14.1 % (ref 11.5–15.5)
WBC: 14.4 10*3/uL — ABNORMAL HIGH (ref 4.0–10.5)
nRBC: 0 % (ref 0.0–0.2)

## 2023-11-20 LAB — COMPREHENSIVE METABOLIC PANEL
ALT: 12 U/L (ref 0–44)
AST: 17 U/L (ref 15–41)
Albumin: 3.9 g/dL (ref 3.5–5.0)
Alkaline Phosphatase: 44 U/L (ref 38–126)
Anion gap: 12 (ref 5–15)
BUN: 38 mg/dL — ABNORMAL HIGH (ref 8–23)
CO2: 21 mmol/L — ABNORMAL LOW (ref 22–32)
Calcium: 9 mg/dL (ref 8.9–10.3)
Chloride: 105 mmol/L (ref 98–111)
Creatinine, Ser: 1.91 mg/dL — ABNORMAL HIGH (ref 0.61–1.24)
GFR, Estimated: 32 mL/min — ABNORMAL LOW (ref >60)
Glucose, Bld: 97 mg/dL (ref 70–99)
Potassium: 4.4 mmol/L (ref 3.5–5.1)
Sodium: 138 mmol/L (ref 135–145)
Total Bilirubin: 1.6 mg/dL — ABNORMAL HIGH (ref 0.0–1.2)
Total Protein: 7 g/dL (ref 6.5–8.1)

## 2023-11-20 LAB — TROPONIN I (HIGH SENSITIVITY)
Troponin I (High Sensitivity): 27 ng/L — ABNORMAL HIGH (ref <18)
Troponin I (High Sensitivity): 30 ng/L — ABNORMAL HIGH (ref <18)
Troponin I (High Sensitivity): 33 ng/L — ABNORMAL HIGH (ref <18)

## 2023-11-20 LAB — EXPECTORATED SPUTUM ASSESSMENT W GRAM STAIN, RFLX TO RESP C

## 2023-11-20 LAB — BRAIN NATRIURETIC PEPTIDE: B Natriuretic Peptide: 301.7 pg/mL — ABNORMAL HIGH (ref 0.0–100.0)

## 2023-11-20 MED ORDER — ALBUTEROL SULFATE (2.5 MG/3ML) 0.083% IN NEBU: 2.5000 mg | INHALATION_SOLUTION | RESPIRATORY_TRACT | Status: DC | PRN

## 2023-11-20 MED ORDER — AMOXICILLIN-POT CLAVULANATE 500-125 MG PO TABS
1.0000 | ORAL_TABLET | Freq: Two times a day (BID) | ORAL | Status: DC
  Administered 2023-11-20 – 2023-11-22 (×4): 1 via ORAL
  Filled 2023-11-20 (×5): qty 1

## 2023-11-20 MED ORDER — ONDANSETRON HCL 4 MG/2ML IJ SOLN: 4.0000 mg | Freq: Three times a day (TID) | INTRAMUSCULAR | Status: DC | PRN

## 2023-11-20 MED ORDER — ENOXAPARIN SODIUM 30 MG/0.3ML IJ SOSY
30.0000 mg | PREFILLED_SYRINGE | INTRAMUSCULAR | Status: DC
  Administered 2023-11-20 – 2023-11-23 (×4): 30 mg via SUBCUTANEOUS
  Filled 2023-11-20 (×4): qty 0.3

## 2023-11-20 MED ORDER — MONTELUKAST SODIUM 10 MG PO TABS
10.0000 mg | ORAL_TABLET | Freq: Every day | ORAL | Status: DC
Start: 2023-11-20 — End: 2023-11-24
  Administered 2023-11-21 – 2023-11-24 (×4): 10 mg via ORAL
  Filled 2023-11-20 (×4): qty 1

## 2023-11-20 MED ORDER — SODIUM CHLORIDE 0.9 % IV BOLUS
1000.0000 mL | Freq: Once | INTRAVENOUS | Status: AC
  Administered 2023-11-20: 1000 mL via INTRAVENOUS

## 2023-11-20 MED ORDER — DM-GUAIFENESIN ER 30-600 MG PO TB12: 1.0000 | ORAL_TABLET | Freq: Two times a day (BID) | ORAL | Status: DC | PRN

## 2023-11-20 MED ORDER — ASPIRIN 81 MG PO TBEC: 81.0000 mg | DELAYED_RELEASE_TABLET | Freq: Every day | ORAL | Status: DC

## 2023-11-20 MED ORDER — ACETAMINOPHEN 325 MG PO TABS
650.0000 mg | ORAL_TABLET | Freq: Four times a day (QID) | ORAL | Status: DC | PRN
  Administered 2023-11-21: 650 mg via ORAL
  Filled 2023-11-20: qty 2

## 2023-11-20 MED ORDER — CLOPIDOGREL BISULFATE 75 MG PO TABS
75.0000 mg | ORAL_TABLET | Freq: Every day | ORAL | Status: DC
  Administered 2023-11-21 – 2023-11-23 (×3): 75 mg via ORAL
  Filled 2023-11-20 (×3): qty 1

## 2023-11-20 MED ORDER — IOHEXOL 350 MG/ML SOLN
75.0000 mL | Freq: Once | INTRAVENOUS | Status: AC | PRN
  Administered 2023-11-20: 60 mL via INTRAVENOUS

## 2023-11-20 MED ORDER — LIDOCAINE 5 % EX PTCH
1.0000 | MEDICATED_PATCH | CUTANEOUS | Status: DC
  Administered 2023-11-20 – 2023-11-23 (×4): 1 via TRANSDERMAL
  Filled 2023-11-20 (×5): qty 1

## 2023-11-20 MED ORDER — HYDRALAZINE HCL 20 MG/ML IJ SOLN: 5.0000 mg | INTRAMUSCULAR | Status: DC | PRN

## 2023-11-20 MED ORDER — FENTANYL CITRATE PF 50 MCG/ML IJ SOSY
12.5000 ug | PREFILLED_SYRINGE | INTRAMUSCULAR | Status: DC | PRN
  Filled 2023-11-20: qty 1

## 2023-11-20 MED ORDER — MORPHINE SULFATE (PF) 2 MG/ML IV SOLN
2.0000 mg | Freq: Once | INTRAVENOUS | Status: DC
  Filled 2023-11-20: qty 1

## 2023-11-20 MED ORDER — METHOCARBAMOL 500 MG PO TABS
500.0000 mg | ORAL_TABLET | Freq: Three times a day (TID) | ORAL | Status: DC | PRN
  Administered 2023-11-20: 500 mg via ORAL
  Filled 2023-11-20: qty 1

## 2023-11-20 MED ORDER — ENSURE ENLIVE PO LIQD
237.0000 mL | Freq: Two times a day (BID) | ORAL | Status: DC
  Administered 2023-11-22 – 2023-11-24 (×5): 237 mL via ORAL

## 2023-11-20 MED ORDER — SIMVASTATIN 20 MG PO TABS
20.0000 mg | ORAL_TABLET | Freq: Every day | ORAL | Status: DC
  Administered 2023-11-21 – 2023-11-23 (×3): 20 mg via ORAL
  Filled 2023-11-20 (×3): qty 1

## 2023-11-20 NOTE — ED Triage Notes (Signed)
 Pt presents to ED with c./o of mechanical fall. Pt states he tripped, pt states R knee pain, L rib pain. Pt denies LOC.

## 2023-11-20 NOTE — H&P (Signed)
 History and Physical    Alexander Webster FMW:[id number] DOB: Aug 15, 1928 DOA: 11/20/2023  Referring MD/NP/PA:   PCP: Morayati, Shamil J, MD   Patient coming from:  The patient is coming from home.     Chief Complaint: fall and syncope  HPI: Alexander Webster is a 89 y.o. male with medical history significant of complete heart block, s/p pacemaker, HLD, CAD, dCHF, CKD-3b, aspiration pneumonia, syncope, who presents with fall and syncope.  Patient reports that 2 days ago he was walking when he felt dizzy and fell. Family reported that pt passed out shortly.  No head injury. She complains of pain in left chest wall and right knee. He has dizziness and feeling of room spinning.  No unilateral numbness or weakness in extremities.  No facial droop or slurred speech. Patient has cough with white mucus production, no chest pain, palpitation, fever or chills.  Denies choking episode.  No nausea, vomiting, diarrhea or abdominal pain.  Denies symptoms of UTI. Of note, pt had dizziness and was sent to ED by cardiologist 11/12/2023.  CTA of head was recommended, however,pt did not wish to stay for further workup and left AMA.   Data reviewed independently and ED Course: pt was found to have WBC 14.4, stable renal function, troponin level 27, temperature normal, blood pressure 130/55, heart rate 88, RR 16, oxygen saturation 98% on room air. X-ray of right knee and right foot negative for bony fracture.  X-ray of ribs showed left seventh and eighth rib fracture.  CTA of head and neck negative for LVO. Pt is placed on telemetry bed for observation.  CT of the chest/pelvis/abdomen 1. No CT evidence of acute traumatic injury to the chest, abdomen, pelvis. 2. Bronchial wall thickening and plugging throughout the bilateral lung bases with scarring or atelectasis of the left lung base. Findings suggest chronic aspiration, possibly ongoing. 3. Sigmoid diverticulosis without evidence of acute diverticulitis. 4. Coronary  artery disease.   Aortic Atherosclerosis (ICD10-I70.0).  EKG: I have personally reviewed.  Sinus rhythm, QTc 481, paced rhythm, heart rate 89, regular   Review of Systems:   General: no fevers, chills, no body weight gain, has fatigue HEENT: no blurry vision, hearing changes or sore throat Respiratory: no dyspnea, coughing, wheezing CV: no chest pain, no palpitations GI: no nausea, vomiting, abdominal pain, diarrhea, constipation GU: no dysuria, burning on urination, increased urinary frequency, hematuria  Ext: no leg edema Neuro: no unilateral weakness, numbness, or tingling, no vision change or hearing loss. Has fall, syncope and dizziness Skin: no rash, no skin tear. MSK: has pain in right knee and left chest wall Heme: No easy bruising.  Travel history: No recent long distant travel.   Allergy:  Allergies  Allergen Reactions   Percocet [Oxycodone-Acetaminophen ] Itching    Itching   Vicodin [Hydrocodone-Acetaminophen ] Itching    Past Medical History:  Diagnosis Date   Dysrhythmia    possible cause of syncope-    Leg fracture    Syncope     Past Surgical History:  Procedure Laterality Date   HERNIA REPAIR Bilateral 05-24-12   Dr Dellie   LEG SURGERY Right 02/08/2015   LOOP RECORDER INSERTION N/A 10/30/2020   Procedure: LOOP RECORDER INSERTION;  Surgeon: Ammon Blunt, MD;  Location: [facility] INVASIVE CV LAB;  Service: Cardiovascular;  Laterality: N/A;   MUSCLE REPAIR Right    calf   PACEMAKER LEADLESS INSERTION N/A 07/07/2021   Procedure: PACEMAKER LEADLESS INSERTION;  Surgeon: Ammon Blunt, MD;  Location: [facility] INVASIVE  CV LAB;  Service: Cardiovascular;  Laterality: N/A;   ruptured disc     ruptured disc repair   TEMPORARY PACEMAKER N/A 07/06/2021   Procedure: TEMPORARY PACEMAKER;  Surgeon: Ammon Blunt, MD;  Location: [facility] INVASIVE CV LAB;  Service: Cardiovascular;  Laterality: N/A;    Social History:  reports that he quit smoking about 29  years ago. His smoking use included cigarettes. He started smoking about 49 years ago. He has a 20 pack-year smoking history. He has never used smokeless tobacco. He reports that he does not drink alcohol and does not use drugs.  Family History:  Family History  Problem Relation Age of Onset   Heart disease Mother    Cancer Mother        lymphoma   Cancer Father        prostate     Prior to Admission medications   Medication Sig Start Date End Date Taking? Authorizing Provider  clopidogrel  (PLAVIX ) 75 MG tablet Take 75 mg by mouth at bedtime. 08/15/20   [provider]  Misc Natural Products (ADV TURMERIC CURCUMIN COMPLEX PO) Take 1 capsule by mouth in the morning and at bedtime. Primal Force Curcumin Triple Burn    [provider]  montelukast  (SINGULAIR ) 10 MG tablet Take 10 mg by mouth daily. 08/15/20   [provider]  pantoprazole  (PROTONIX ) 40 MG tablet Take 1 tablet (40 mg total) by mouth daily. 03/18/21   Patel, Sona, MD  simvastatin  (ZOCOR ) 20 MG tablet Take 20 mg by mouth at bedtime. 09/14/20   [provider]  TRELEGY ELLIPTA 100-62.5-25 MCG/INH AEPB Inhale 1 puff into the lungs daily. 05/21/21   [provider]    Physical Exam: Vitals:   11/20/23 1625 11/20/23 1647 11/20/23 2104 11/20/23 2130  BP:  (!) 130/55 (!) 109/55 (!) 101/56  Pulse: 88 84 78   Resp: 16 16 15    Temp:  98.5 F (36.9 C) 98 F (36.7 C)   TempSrc:  Oral Oral   SpO2: 98% 98%    Weight:    70.9 kg  Height:    5' 7 (1.702 m)   General: Not in acute distress HEENT:       Eyes: PERRL, EOMI, no jaundice       ENT: No discharge from the ears and nose, no pharynx injection, no tonsillar enlargement.        Neck: No JVD, no bruit, no mass felt. Heme: No neck lymph node enlargement. Cardiac: S1/S2, RRR, No murmurs, No gallops or rubs. Respiratory: No rales, wheezing, rhonchi or rubs. GI: Soft, nondistended, nontender, no rebound pain, no organomegaly, BS  present. GU: No hematuria Ext: No pitting leg edema bilaterally. 1+DP/PT pulse bilaterally.  Has varicose vein in both legs Musculoskeletal: No joint deformities, No joint redness or warmth, no limitation of ROM in spin. Skin: No rashes.  Neuro: Alert, oriented X3, cranial nerves II-XII grossly intact, moves all extremities normally. Psych: Patient is not psychotic, no suicidal or hemocidal ideation.  Labs on Admission: I have personally reviewed following labs and imaging studies  CBC: Recent Labs  Lab 11/20/23 1535  WBC 14.4*  NEUTROABS 11.8*  HGB 14.5  HCT 44.2  MCV 95.9  PLT 207   Basic Metabolic Panel: Recent Labs  Lab 11/20/23 1535  NA 138  K 4.4  CL 105  CO2 21*  GLUCOSE 97  BUN 38*  CREATININE 1.91*  CALCIUM 9.0   GFR: Estimated Creatinine Clearance: 22.6 mL/min (A) (by C-G  formula based on SCr of 1.91 mg/dL (H)). Liver Function Tests: Recent Labs  Lab 11/20/23 1535  AST 17  ALT 12  ALKPHOS 44  BILITOT 1.6*  PROT 7.0  ALBUMIN 3.9   No results for input(s): LIPASE, AMYLASE in the last 168 hours. No results for input(s): AMMONIA in the last 168 hours. Coagulation Profile: No results for input(s): INR, PROTIME in the last 168 hours. Cardiac Enzymes: No results for input(s): CKTOTAL, CKMB, CKMBINDEX, TROPONINI in the last 168 hours. BNP (last 3 results) No results for input(s): PROBNP in the last 8760 hours. HbA1C: No results for input(s): HGBA1C in the last 72 hours. CBG: No results for input(s): GLUCAP in the last 168 hours. Lipid Profile: No results for input(s): CHOL, HDL, LDLCALC, TRIG, CHOLHDL, LDLDIRECT in the last 72 hours. Thyroid Function Tests: No results for input(s): TSH, T4TOTAL, FREET4, T3FREE, THYROIDAB in the last 72 hours. Anemia Panel: No results for input(s): VITAMINB12, FOLATE, FERRITIN, TIBC, IRON, RETICCTPCT in the last 72 hours. Urine analysis:    Component Value  Date/Time   COLORURINE YELLOW (A) 11/20/2023 1934   APPEARANCEUR CLEAR (A) 11/20/2023 1934   APPEARANCEUR Clear 02/09/2015 0054   LABSPEC 1.027 11/20/2023 1934   LABSPEC 1.028 02/09/2015 0054   PHURINE 5.0 11/20/2023 1934   GLUCOSEU 50 (A) 11/20/2023 1934   GLUCOSEU Negative 02/09/2015 0054   HGBUR MODERATE (A) 11/20/2023 1934   BILIRUBINUR NEGATIVE 11/20/2023 1934   BILIRUBINUR Negative 02/09/2015 0054   KETONESUR NEGATIVE 11/20/2023 1934   PROTEINUR 30 (A) 11/20/2023 1934   NITRITE NEGATIVE 11/20/2023 1934   LEUKOCYTESUR NEGATIVE 11/20/2023 1934   LEUKOCYTESUR Negative 02/09/2015 0054   Sepsis Labs: @LABRCNTIP (procalcitonin:4,lacticidven:4) ) Recent Results (from the past 240 hours)  Expectorated Sputum Assessment w Gram Stain, Rflx to Resp Cult     Status: None   Collection Time: 11/20/23  7:38 PM   Specimen: Expectorated Sputum  Result Value Ref Range Status   Specimen Description EXPECTORATED SPUTUM  Final   Special Requests NONE  Final   Sputum evaluation   Final    THIS SPECIMEN IS ACCEPTABLE FOR SPUTUM CULTURE Performed at [facility], [address] [place], [place] [address]    Report Status 11/20/2023 FINAL  Final     Radiological Exams on Admission:   Assessment/Plan Principal Problem:   Syncope Active Problems:   Fall at home, initial encounter   Left rib fracture   Aspiration pneumonia (HCC)   CAD (coronary artery disease)   Myocardial injury   Chronic obstructive pulmonary disease (HCC)   Stage 3b chronic kidney disease (HCC)   HLD (hyperlipidemia)   Chronic diastolic CHF (congestive heart failure) (HCC)   Assessment and Plan:  Syncope: Etiology is not clear. The differential diagnosis is broad, including vasovagal syncope, cardiac arrhythmia, pacemaker malfunction, TIA, orthostatic status. CTA of head and neck negative for LVO.  Patient does not have signs for stroke.  ED physician tried to call company for pacemaker interrogation,  but no response from them.  Patient has regularly paced rhythm on EKG.  - Place on tele for obs - Orthostatic vital signs  - 2d echo - Neuro checks  - IVF: 1L of NS - PT/OT eval and treat  Fall at home, initial encounter -PT/OT -Fall precaution  Left rib fracture -Incentive spirometry -Pain control: As needed fentanyl , Tylenol , Robaxin  -Lidoderm   Chronic aspiration pneumonia [facility]): CT scan showed possible chronic aspiration.  Patient has a cough with white mucus production, no shortness of breath  or chest pain.  No oxygen desaturation.  Denies choking.  No fever, blood has leukocytosis.  Will start antibiotics empirically -start Augmentin  -Bronchodilators and as needed Mucinex  -Sputum culture  CAD (coronary artery disease) and myocardial injury: Troponin minimally elevated at 27.  No chest pain -Plavix , Zocor  -Trend troponin -Check A1c, FLP  Chronic obstructive pulmonary disease (HCC): Stable -Bronchodilators and as needed Mucinex  -Singulair   Stage 3b chronic kidney disease (HCC): Renal function at baseline.  Recent baseline creatinine 2.11 on 11/12/2023.  His creatinine is 1.91, BUN 38, GFR 32 -Follow-up with BMP  HLD (hyperlipidemia) -Zocor   Chronic diastolic CHF (congestive heart failure) (HCC): 2D echo on 03/10/2016 showed EF 50% with grade 1 diastolic dysfunction.  Patient has elevated BNP 301, but denies SOB, no leg edema, does not seem to have CHF exacerbation. -Watch volume status closely       DVT ppx:  SQ Lovenox   Code Status: Full code (want be code once if happens)  Family Communication: Yes, patient's nephew   at bed side.      Disposition Plan:  Anticipate discharge back to previous environment  Consults called:  none  Admission status and Level of care: Telemetry Medical:    for obs     Dispo: The patient is from: Home              Anticipated d/c is to: Home              Anticipated d/c date is: 1 day              Patient currently is not  medically stable to d/c.    Severity of Illness:  The appropriate patient status for this patient is OBSERVATION. Observation status is judged to be reasonable and necessary in order to provide the required intensity of service to ensure the patient's safety. The patient's presenting symptoms, physical exam findings, and initial radiographic and laboratory data in the context of their medical condition is felt to place them at decreased risk for further clinical deterioration. Furthermore, it is anticipated that the patient will be medically stable for discharge from the hospital within 2 midnights of admission.        Date of Service 11/20/2023    Caleb Exon [facility]   If 7PM-7AM, please contact night-coverage [email or web address] 11/20/2023, 10:30 PM

## 2023-11-20 NOTE — ED Provider Notes (Signed)
 [facility] Provider Note    Event Date/Time   First MD Initiated Contact with Patient 11/20/23 1511     (approximate)   History   Fall   HPI  Alexander Webster is a 89 year old male with history of heart block s/p pacemaker, CKD presenting to the emergency department for evaluation after a fall.  Patient reports that 2 days ago he was walking when he felt dizzy and fell onto his wife.  Does think he passed out.  Does not think he hit his head.  Has been ambulatory since that time but reports rib and knee pain.  Today he was walking when he had recurrent dizziness described as a spinning sensation.  His legs began to feel weak and he lowered himself to the ground.  His wife called 911.  I did review his ER visit from 11/12/2023.  At that time, he was sent in from the cardiology outpatient office for dizziness with concerns for orthostatic hypotension.  Labs are without critical derangements, but per recommendation from cardiology, CTA of the head was initially recommended as well as IV fluid resuscitation.  However, patient did not wish to stay for further workup and left AMA.      Physical Exam   Triage Vital Signs: ED Triage Vitals  Encounter Vitals Group     BP 11/20/23 1234 105/69     Systolic BP Percentile --      Diastolic BP Percentile --      Pulse Rate 11/20/23 1234 78     Resp 11/20/23 1234 19     Temp 11/20/23 1234 98.6 F (37 C)     Temp Source 11/20/23 1234 Oral     SpO2 11/20/23 1234 93 %     Weight --      Height --      Head Circumference --      Peak Flow --      Pain Score 11/20/23 1235 10     Pain Loc --      Pain Education --      Exclude from Growth Chart --     Most recent vital signs: Vitals:   11/20/23 1625 11/20/23 1647  BP:  (!) 130/55  Pulse: 88 84  Resp: 16 16  Temp:  98.5 F (36.9 C)  SpO2: 98% 98%   Nursing notes and vital signs reviewed.  General: Adult male, laying in bed, awake and interactive Head:  Atraumatic Neck: No midline pain Chest: Symmetric chest rise, tenderness to palpation over the left lateral chest wall Cardiac: Regular rhythm and rate.  Respiratory: Lungs clear to auscultation Abdomen: Soft, nondistended. No tenderness to palpation.  Pelvis: Stable in AP and lateral compression. No tenderness to palpation. MSK: No deformity to bilateral upper and lower extremity.  Limited range of motion with pain over the medial aspect of the right knee, otherwise freely ranging extremities. Neuro: Alert, oriented. GCS 15. Normal sensation to light touch in bilateral upper and lower extremity. Skin: No evidence of burns or lacerations.   ED Results / Procedures / Treatments   Labs (all labs ordered are listed, but only abnormal results are displayed) Labs Reviewed  CBC WITH DIFFERENTIAL/PLATELET - Abnormal; Notable for the following components:      Result Value   WBC 14.4 (*)    Neutro Abs 11.8 (*)    Monocytes Absolute 1.5 (*)    All other components within normal limits  COMPREHENSIVE METABOLIC PANEL - Abnormal; Notable for  the following components:   CO2 21 (*)    BUN 38 (*)    Creatinine, Ser 1.91 (*)    Total Bilirubin 1.6 (*)    GFR, Estimated 32 (*)    All other components within normal limits  TROPONIN I (HIGH SENSITIVITY) - Abnormal; Notable for the following components:   Troponin I (High Sensitivity) 27 (*)    All other components within normal limits  URINALYSIS, ROUTINE W REFLEX MICROSCOPIC     EKG EKG independently reviewed interpreted by myself (ER attending) demonstrates:  EKG demonstrates paced rhythm at a rate of 89, PR 198, QRS 144, QTC 41, no appreciable superimposed ischemia  RADIOLOGY Imaging independently reviewed and interpreted by myself demonstrates:  Knee x-Dama Hedgepeth without acute fracture Foot x-Helane Briceno without acute fracture Rib x-Alizzon Dioguardi notes fractures of the seventh and eighth ribs without pneumothorax or focal consolidation CTA head and neck  without acute findings CT chest abdomen pelvis without obvious traumatic injuries  PROCEDURES:  Critical Care performed: No  Procedures   MEDICATIONS ORDERED IN ED: Medications  morphine  (PF) 2 MG/ML injection 2 mg (2 mg Intravenous Patient Refused/Not Given 11/20/23 1559)  sodium chloride  0.9 % bolus 1,000 mL (0 mLs Intravenous Stopped 11/20/23 1813)  iohexol  (OMNIPAQUE ) 350 MG/ML injection 75 mL (60 mLs Intravenous Contrast Given 11/20/23 1733)     IMPRESSION / MDM / ASSESSMENT AND PLAN / ED COURSE  I reviewed the triage vital signs and the nursing notes.  Differential diagnosis includes, but is not limited to, rib fracture, pneumothorax, extremity injury, anemia, electrolyte abnormality, dehydration, acute intracranial process  Patient's presentation is most consistent with acute presentation with potential threat to life or bodily function.  89 year old male presenting with rib pain and knee pain after a fall in the setting of ongoing dizziness with recent syncopal episode.  X-Emme Rosenau with rib fractures.  Recently seen for dizziness, CTA head and neck recommended at that time, order now.  With blunt trauma and patient's age, will also obtain CT chest, abdomen, pelvis.  IV fluids and pain medication ordered.  CT imaging overall reassuring, but clinical history of ongoing dizziness now with syncope is concerning.  Additionally with recent fall and rib fractures.  With this, do think admission is reasonable.  Patient is agreeable with this plan.  Will reach out to hospitalist team.  Case reviewed with Dr. Hilma.  He will evaluate the patient for anticipated admission.     FINAL CLINICAL IMPRESSION(S) / ED DIAGNOSES   Final diagnoses:  Syncope and collapse  Closed fracture of multiple ribs of left side, initial encounter     Rx / DC Orders   ED Discharge Orders     None        Note:  This document was prepared using Dragon voice recognition software and may include  unintentional dictation errors.   Levander Slate, MD 11/20/23 2042

## 2023-11-20 NOTE — ED Triage Notes (Addendum)
 First nurse note: Pt to ED via [facility] from home. EMS reports pt states legs got tangled up and he fell. Right knee and rib pain. No LOC. Ems concerned for orthostatic BP drop and placed pt in recliner on arrival.   EMS VS:  BP 108/62 98% RA HR 80

## 2023-11-20 NOTE — ED Notes (Signed)
Pt resting comfortably in bed. Wife at bedside.

## 2023-11-20 NOTE — ED Notes (Signed)
 Pt has other kind of pacemaker that unable to interrogate with equipment here.

## 2023-11-21 ENCOUNTER — Observation Stay: Payer: Medicare Other

## 2023-11-21 ENCOUNTER — Observation Stay
Admit: 2023-11-21 | Discharge: 2023-11-21 | Disposition: A | Discharge status: Home / Self Care | Payer: Medicare Other | Attending: Internal Medicine | Admitting: Internal Medicine

## 2023-11-21 DIAGNOSIS — Z950 Presence of cardiac pacemaker: Secondary | ICD-10-CM | POA: Diagnosis not present

## 2023-11-21 DIAGNOSIS — S2242XA Multiple fractures of ribs, left side, initial encounter for closed fracture: Secondary | ICD-10-CM | POA: Diagnosis present

## 2023-11-21 DIAGNOSIS — Z7902 Long term (current) use of antithrombotics/antiplatelets: Secondary | ICD-10-CM | POA: Diagnosis not present

## 2023-11-21 DIAGNOSIS — Z66 Do not resuscitate: Secondary | ICD-10-CM | POA: Diagnosis present

## 2023-11-21 DIAGNOSIS — Z8249 Family history of ischemic heart disease and other diseases of the circulatory system: Secondary | ICD-10-CM | POA: Diagnosis not present

## 2023-11-21 DIAGNOSIS — T17908A Unspecified foreign body in respiratory tract, part unspecified causing other injury, initial encounter: Secondary | ICD-10-CM | POA: Diagnosis not present

## 2023-11-21 DIAGNOSIS — R55 Syncope and collapse: Secondary | ICD-10-CM | POA: Diagnosis present

## 2023-11-21 DIAGNOSIS — W19XXXA Unspecified fall, initial encounter: Secondary | ICD-10-CM | POA: Diagnosis present

## 2023-11-21 DIAGNOSIS — I5A Non-ischemic myocardial injury (non-traumatic): Secondary | ICD-10-CM | POA: Diagnosis present

## 2023-11-21 DIAGNOSIS — I442 Atrioventricular block, complete: Secondary | ICD-10-CM | POA: Diagnosis present

## 2023-11-21 DIAGNOSIS — Z87891 Personal history of nicotine dependence: Secondary | ICD-10-CM | POA: Diagnosis not present

## 2023-11-21 DIAGNOSIS — M25561 Pain in right knee: Secondary | ICD-10-CM

## 2023-11-21 DIAGNOSIS — E785 Hyperlipidemia, unspecified: Secondary | ICD-10-CM | POA: Diagnosis present

## 2023-11-21 DIAGNOSIS — J449 Chronic obstructive pulmonary disease, unspecified: Secondary | ICD-10-CM | POA: Diagnosis present

## 2023-11-21 DIAGNOSIS — J690 Pneumonitis due to inhalation of food and vomit: Secondary | ICD-10-CM | POA: Diagnosis present

## 2023-11-21 DIAGNOSIS — Y92009 Unspecified place in unspecified non-institutional (private) residence as the place of occurrence of the external cause: Secondary | ICD-10-CM | POA: Diagnosis not present

## 2023-11-21 DIAGNOSIS — I700 Atherosclerosis of aorta: Secondary | ICD-10-CM | POA: Diagnosis present

## 2023-11-21 DIAGNOSIS — Z8042 Family history of malignant neoplasm of prostate: Secondary | ICD-10-CM | POA: Diagnosis not present

## 2023-11-21 DIAGNOSIS — Z885 Allergy status to narcotic agent status: Secondary | ICD-10-CM | POA: Diagnosis not present

## 2023-11-21 DIAGNOSIS — D72829 Elevated white blood cell count, unspecified: Secondary | ICD-10-CM

## 2023-11-21 DIAGNOSIS — I951 Orthostatic hypotension: Secondary | ICD-10-CM | POA: Diagnosis present

## 2023-11-21 DIAGNOSIS — Z807 Family history of other malignant neoplasms of lymphoid, hematopoietic and related tissues: Secondary | ICD-10-CM | POA: Diagnosis not present

## 2023-11-21 DIAGNOSIS — Z7951 Long term (current) use of inhaled steroids: Secondary | ICD-10-CM | POA: Diagnosis not present

## 2023-11-21 DIAGNOSIS — Z79899 Other long term (current) drug therapy: Secondary | ICD-10-CM | POA: Diagnosis not present

## 2023-11-21 DIAGNOSIS — N1832 Chronic kidney disease, stage 3b: Secondary | ICD-10-CM | POA: Diagnosis present

## 2023-11-21 DIAGNOSIS — I2510 Atherosclerotic heart disease of native coronary artery without angina pectoris: Secondary | ICD-10-CM | POA: Diagnosis present

## 2023-11-21 DIAGNOSIS — I5032 Chronic diastolic (congestive) heart failure: Secondary | ICD-10-CM | POA: Diagnosis present

## 2023-11-21 LAB — CBC
HCT: 38.2 % — ABNORMAL LOW (ref 39.0–52.0)
Hemoglobin: 13.1 g/dL (ref 13.0–17.0)
MCH: 31.8 pg (ref 26.0–34.0)
MCHC: 34.3 g/dL (ref 30.0–36.0)
MCV: 92.7 fL (ref 80.0–100.0)
Platelets: 182 10*3/uL (ref 150–400)
RBC: 4.12 MIL/uL — ABNORMAL LOW (ref 4.22–5.81)
RDW: 14 % (ref 11.5–15.5)
WBC: 11.3 10*3/uL — ABNORMAL HIGH (ref 4.0–10.5)
nRBC: 0 % (ref 0.0–0.2)

## 2023-11-21 LAB — LIPID PANEL
Cholesterol: 106 mg/dL (ref 0–200)
HDL: 45 mg/dL (ref >40)
LDL Cholesterol: 48 mg/dL (ref 0–99)
Total CHOL/HDL Ratio: 2.4 {ratio}
Triglycerides: 66 mg/dL (ref <150)
VLDL: 13 mg/dL (ref 0–40)

## 2023-11-21 LAB — BASIC METABOLIC PANEL
Anion gap: 7 (ref 5–15)
BUN: 33 mg/dL — ABNORMAL HIGH (ref 8–23)
CO2: 23 mmol/L (ref 22–32)
Calcium: 8.6 mg/dL — ABNORMAL LOW (ref 8.9–10.3)
Chloride: 107 mmol/L (ref 98–111)
Creatinine, Ser: 1.7 mg/dL — ABNORMAL HIGH (ref 0.61–1.24)
GFR, Estimated: 37 mL/min — ABNORMAL LOW (ref >60)
Glucose, Bld: 112 mg/dL — ABNORMAL HIGH (ref 70–99)
Potassium: 4.2 mmol/L (ref 3.5–5.1)
Sodium: 137 mmol/L (ref 135–145)

## 2023-11-21 LAB — TROPONIN I (HIGH SENSITIVITY): Troponin I (High Sensitivity): 34 ng/L — ABNORMAL HIGH (ref <18)

## 2023-11-21 MED ORDER — PANTOPRAZOLE SODIUM 40 MG PO TBEC
40.0000 mg | DELAYED_RELEASE_TABLET | Freq: Every day | ORAL | Status: DC
  Administered 2023-11-21 – 2023-11-24 (×4): 40 mg via ORAL
  Filled 2023-11-21 (×4): qty 1

## 2023-11-21 MED ORDER — ORAL CARE MOUTH RINSE: 15.0000 mL | OROMUCOSAL | Status: DC | PRN

## 2023-11-21 MED ORDER — ORAL CARE MOUTH RINSE
15.0000 mL | OROMUCOSAL | Status: DC
  Administered 2023-11-21 – 2023-11-24 (×10): 15 mL via OROMUCOSAL

## 2023-11-21 MED ORDER — SODIUM CHLORIDE 0.9 % IV SOLN: INTRAVENOUS | Status: AC

## 2023-11-21 MED ORDER — ACETAMINOPHEN 500 MG PO TABS
1000.0000 mg | ORAL_TABLET | Freq: Four times a day (QID) | ORAL | Status: DC
  Administered 2023-11-21 – 2023-11-24 (×12): 1000 mg via ORAL
  Filled 2023-11-21 (×12): qty 2

## 2023-11-21 NOTE — Assessment & Plan Note (Signed)
-   No wheezing or signs of exacerbation at this time - Continue PRN breathing treatments

## 2023-11-21 NOTE — Assessment & Plan Note (Signed)
-   Flat troponin leak considered from mild demand from fall and pain - Follow-up echo - EKG with ventricular paced rhythm, otherwise unhelpful -Patient has no actual chest pain, mostly left-sided chest wall pain from rib fractures

## 2023-11-21 NOTE — Assessment & Plan Note (Signed)
-   No signs or symptoms of exacerbation nor any volume overload - Last echo August 2022.  EF 55 to 60%, normal diastolic parameters, no RWMA. - Follow-up repeat echo

## 2023-11-21 NOTE — Plan of Care (Signed)

## 2023-11-21 NOTE — Evaluation (Addendum)
 Clinical/Bedside Swallow Evaluation Patient Details  Name: Alexander Webster MRN: [id number] Date of Birth: 01/23/1928  Today's Date: 11/21/2023 Time: SLP Start Time (ACUTE ONLY): 0920 SLP Stop Time (ACUTE ONLY): 1010 SLP Time Calculation (min) (ACUTE ONLY): 50 min  Past Medical History:  Past Medical History:  Diagnosis Date   Dysrhythmia    possible cause of syncope-    Leg fracture    Syncope    Past Surgical History:  Past Surgical History:  Procedure Laterality Date   HERNIA REPAIR Bilateral 05-24-12   Dr Dellie   LEG SURGERY Right 02/08/2015   LOOP RECORDER INSERTION N/A 10/30/2020   Procedure: LOOP RECORDER INSERTION;  Surgeon: Ammon Blunt, MD;  Location: [facility] INVASIVE CV LAB;  Service: Cardiovascular;  Laterality: N/A;   MUSCLE REPAIR Right    calf   PACEMAKER LEADLESS INSERTION N/A 07/07/2021   Procedure: PACEMAKER LEADLESS INSERTION;  Surgeon: Ammon Blunt, MD;  Location: [facility] INVASIVE CV LAB;  Service: Cardiovascular;  Laterality: N/A;   ruptured disc     ruptured disc repair   TEMPORARY PACEMAKER N/A 07/06/2021   Procedure: TEMPORARY PACEMAKER;  Surgeon: Ammon Blunt, MD;  Location: [facility] INVASIVE CV LAB;  Service: Cardiovascular;  Laterality: N/A;   HPI:  Pt is a 89 y.o. male with PMH CHB s/p Medtronic Micra AV leadless (07/07/21), HLD, CAD, dCHF, CKD-3b, syncope w/ fall who presented with a fall and syncope.  Patient reported approximately 2-3 days prior to admission he felt dizzy when standing and walking resulting in a Fall - presumed rib fractures then.  Nephew stated pt is more Sedentary in recent months and sedentary since the the Fall moreso.  He endorses that he has been feeling more dizzy recently especially when first standing which gets better after he does not move some.  No unilateral numbness or weakness in extremities.  No facial droop or slurred speech.  Pt did come to the ED previously and a CTA of head was recommended, however, pt did not  wish to stay for further workup and left AMA. Since his recent Fall, he has had ongoing right knee pain and swelling as well.  Also having left-sided chest wall pains - Imaging this admit revealed displaced fractures of the left seventh and eighth ribs.  NOTE: pt has a Baseline of GERD on Nexium 40mg  prior, PPI at home.  [place] swallow [place] ([place]) revealed changes of presbyesophagus. There was mild luminal narrowing at the level of the non-reducible hiatal hernia..  Pt endorsed s/s of ongoing REFLUX at home prior to this event; he is not followed by GI currently.  Pt also stated he had his Esophagus Stretched about 5 years ago.   Imaging at admit: Lungs/Pleura: Bronchial wall thickening and plugging throughout the bilateral lung bases with scarring or atelectasis of the left lung base. No pleural effusion.    Assessment / Plan / Recommendation  Clinical Impression   Pt seen for BSE. Pt awake, verbally engaged w/ SLP and nephew present. Followed all instructions. Min HOH. Quite pleasant.  On RA, afebrile. Wbc 11.3 trending down since yesterday.  Pt appears to present w/ functional oropharyngeal phase swallowing w/ No overt oropharyngeal phase dysphagia appreciated w/ po trials; No sensorimotor deficits appreciated. Pt appears at reduced risk for aspiration from an oropharyngeal phase standpoint following general aspiration precautions. Pt has a baseline of GERD/REFLUX, and has been on Nexium/PPI at home per prior chart notes/admit. ANY Esophageal Dysmotility or Regurgitation of Reflux material can increase risk  for aspiration of the Reflux material during Retrograde flow thus impact Pulmonary status. Pt has described issues w/ s/s of REFLUX at home.   During po trials, pt was supported in a more upright position in the bed for po's of thin liquids Via Cup, puree, and soft solids moistened well. He consumed all consistencies w/ no overt clinical s/s of aspiration noted; no cough, a clear  vocal quality b/t trials, no decline in pulmonary status, no multiple swallows. O2 sats remained upper 90s. Oral phase appeared [facility] for bolus management, mastication, and timely A-P transfer/clearing of material. Oral clearing achieved w/ all consistencies.  OM exam was [facility] for oral clearing; lingual/labial movements. No unilateral weakness. Speech clear.  Of Note, pt exhibited intermittent Belching w/ po intake, liquids.  Of Note, pt practiced swallowing Pills WHOLE in Puree during session, w/ NSG present. Tolerated well and appeared to like the ease of swallowing the Pills in applesauce. Nephew noted also.  Recommend continue a fairly Regular diet (w/ Cut/Chopped meats, moistened foods) w/ thin liquids via Cup. Less air swallowed vs w/ straw use. Recommend general aspiration precautions. Rest Breaks during meals/oral intake to allow for Esophageal clearing and to combat fatigue factor. REFLUX precautions strongly recommended to lessen chance for Regurgitation and a more REFLUX-friendly diet of foods. Tray setup and sitting support at meals currently. Recommend pt f/u w/ GI for management of GERD/REFLUX and tx; PPI. Pills WHOLE in Puree for safer swallowing. Discussion and handouts given on aspiration precautions, reflux, and recommendation of Pills in Puree to pt and Nephew. Thorough education given and all questions answered. Precautions posted in chart and on board in room. No further acute ST services indicated at this time as pt appears at his Baseline swallowing. MD/NSG updated and will reconsult ST services if any new needs while admitted.  SLP Visit Diagnosis: Dysphagia, unspecified (R13.10)    Aspiration Risk   (reduced from an oropharyngeal phase standpoint - mildly increased from an Esophageal phase standpoint)    Diet Recommendation   Thin;Age appropriate regular (cut/moistened meats and foods) - a fairly Regular diet (w/ Cut/Chopped meats, moistened foods) w/ thin liquids via Cup. Less air  swallowed vs w/ straw use. Recommend general aspiration precautions. Rest Breaks during meals/oral intake to allow for Esophageal clearing and to combat fatigue factor. REFLUX precautions strongly recommended to lessen chance for Regurgitation and a more REFLUX-friendly diet of foods. Tray setup and sitting support at meals currently.   Medication Administration: Whole meds with puree (for safer swallowing in setting of advance age)    Other  Recommendations Recommended Consults: Consider GI evaluation;Consider esophageal assessment (Outpt if needed for GERD management; PPI) Oral Care Recommendations: Oral care BID;Oral care before and after PO;Patient independent with oral care (setup)    Recommendations for follow up therapy are one component of a multi-disciplinary discharge planning process, led by the attending physician.  Recommendations may be updated based on patient status, additional functional criteria and insurance authorization.  Follow up Recommendations No SLP follow up (appears at his baseline)      Assistance Recommended at Discharge  PRN>intermittent for setup  Functional Status Assessment Patient has not had a recent decline in their functional status  Frequency and Duration  (n/a)   (n/a)       Prognosis Prognosis for improved oropharyngeal function: Good Barriers to Reach Goals: Time post onset;Severity of deficits Barriers/Prognosis Comment: GERD; recent fall w/ rib fxs and sedentary      Swallow Study  General Date of Onset: 11/20/23 HPI: Pt is a 89 y.o. male with PMH CHB s/p Medtronic Micra AV leadless (07/07/21), HLD, CAD, dCHF, CKD-3b, syncope w/ fall who presented with a fall and syncope.  Patient reported approximately 2-3 days prior to admission he felt dizzy when standing and walking resulting in a Fall - presumed rib fractures then.  Nephew stated pt is more Sedentary in recent months and sedentary since the the Fall moreso.  He endorses that he has been  feeling more dizzy recently especially when first standing which gets better after he does not move some.  No unilateral numbness or weakness in extremities.  No facial droop or slurred speech.  Pt did come to the ED previously and a CTA of head was recommended, however, pt did not wish to stay for further workup and left AMA. Since his recent Fall, he has had ongoing right knee pain and swelling as well.  Also having left-sided chest wall pains - Imaging this admit revealed displaced fractures of the left seventh and eighth ribs.  NOTE: pt has a Baseline of GERD on Nexium 40mg  prior, PPI at home.  In [place], [place] ([place]) revealed changes of presbyesophagus. There was mild luminal narrowing at the level of the non-reducible hiatal hernia..  Pt endorsed s/s of ongoing REFLUX at home prior to this event; he is not followed by GI currently.  Pt also stated he had his Esophagus Stretched about 5 years ago. Type of Study: Bedside Swallow Evaluation Previous Swallow Assessment: 03/2021 - regular/mech soft diet w/ thins then w/ reflux precs. Diet Prior to this Study: Regular;Thin liquids (Level 0) Temperature Spikes Noted: No (wbc 11.3 trending down) Respiratory Status: Room air History of Recent Intubation: No Behavior/Cognition: Alert;Cooperative;Pleasant mood (min HOH) Oral Cavity Assessment: Within Functional Limits Oral Care Completed by SLP: Yes Oral Cavity - Dentition: Adequate natural dentition Vision: Functional for self-feeding Self-Feeding Abilities: Able to feed self;Needs set up Patient Positioning: Upright in bed (needed support for sitting fully upright) Baseline Vocal Quality: Normal Volitional Cough: Strong Volitional Swallow: Able to elicit    Oral/Motor/Sensory Function Overall Oral Motor/Sensory Function: Within functional limits   Ice Chips Ice chips: Within functional limits Presentation: Spoon (fed; 1 trial)   Thin Liquid Thin Liquid: Within functional  limits Presentation: Cup;Self Fed (~7-8 ozs) Other Comments: belching    Nectar Thick Nectar Thick Liquid: Not tested   Honey Thick Honey Thick Liquid: Not tested   Puree Puree: Within functional limits Presentation: Self Fed;Spoon (7 trials)   Solid     Solid: Within functional limits Presentation: Self Fed (moistened trials - 6)        Comer Portugal, MS, CCC-SLP Speech Language Pathologist Rehab Services; [facility] [place] [phone] (ascom) Orazio Weller 11/21/2023,4:19 PM

## 2023-11-21 NOTE — Hospital Course (Addendum)
 89yo with h/o pacemaker (Medtronic Micra AV leadless (07/07/21), HLD, CAD, dCHF, CKD-3b, and aspiration pneumonia who presented on 1/4 with a fall and syncope.  Of note, pt had dizziness and was sent to ED by cardiologist 11/12/2023.  CTA of head was recommended, however, pt did not wish to stay for further workup and left AMA. Since his recent fall he has had ongoing right knee pain and swelling as well as left-sided chest wall pains.  Multiple imaging studies performed and he was found to have mildly displaced fractures of the left seventh and eighth ribs.

## 2023-11-21 NOTE — Assessment & Plan Note (Signed)
-   Patient has been endorsing dizziness when standing and first ambulating recently, raising concern for orthostasis -Other considered differential and etiology would be cardiac given history of CHB with pacemaker placement 2022 -Follow-up pacemaker interrogation - Orthostatic blood pressure performed 11/21/2023 and grossly positive (110/60 >> 95/56 >> 61/51) -Starting on IV fluids - Do not see any antihypertensives or rate control agents on home med list -Repeat orthostatics after fluid resuscitation for at least 24 hours

## 2023-11-21 NOTE — Assessment & Plan Note (Signed)
-   Low concern for infection at this time - Suspected reactive from fall and stress response

## 2023-11-21 NOTE — Assessment & Plan Note (Signed)
-   CT chest shows bronchial wall thickening and plugging throughout bilateral bases with scarring or atelectasis of left lung base concerning for chronic aspiration - Continue Augmentin for now - Follow-up SLP eval

## 2023-11-21 NOTE — Assessment & Plan Note (Signed)
-   patient has history of CKD3b. Baseline creat ~ 1.6, eGFR~ 40 - at baseline

## 2023-11-21 NOTE — Assessment & Plan Note (Signed)
-   Continue Plavix, Zocor

## 2023-11-21 NOTE — Assessment & Plan Note (Signed)
-   Presumed due to orthostatic hypotension - Continue working with PT as able

## 2023-11-21 NOTE — Evaluation (Addendum)
 Physical Therapy Evaluation Patient Details Name: Alexander Webster MRN: [id number] DOB: 1928-09-24 Today's Date: 11/21/2023  History of Present Illness  Patient is a 89 year old male who fell 2 days ago when walking due to feeling dizzy. Family reports patient passed out, no head injury, c/o of L chest wall and R knee pain. Patient was sent to ED on 11/12/23 but left AMA.  X ray: R knee and foot negative for fracture: ribs: L 7th and 8th rib fx. Pmh includes pacemaker, HLD, CAD, dCHF, CKD 3b, aspiration pneumonia, and syncope.   Clinical Impression  Patient is a very pleasant 89 year old man. Patient is in bed up on PT and nursing arrival and agreeable for evaluation and orthostatic assessment. Prior to hospital admission, pt was independent and lives with his wife in a 1.5 story house with stairs to enter. Patient's bathroom is handicap accessible for his wife.   Upon start of evaluation patient reports his R knee is more painful than his broken 7th and 8th rib. PT and nurse performed orthostatics (see below for details) with significant drops noted and patient reporting symptomatic pain. Patient is very limited by R knee pain, requires max A to obtain EOB due to pain and fear of movement of R knee. Upon sitting he requires gentle placement of leg to ground and is unable to weightbear when attempting standing. He is unable to tolerate standing 2 minutes due to pain and dizziness. Patient returned to bed and R knee assessed. R knee has significant edema, warmth, and tenderness. He is unable to tolerate special testing for ligamentous/musculoskeletal involvement. Patient returned to comfortable position in bed with needs met.  PT messaged physician about need for ortho consult as well as patient's vitals.  At this time patient is not safe to return home due to high fall risk. Pt would benefit from skilled PT to address noted impairments and functional limitations (see below for any additional details).        Supine: 110/60 Seated EOB: 95/56 Standing: 61/57    If plan is discharge home, recommend the following: A lot of help with walking and/or transfers;A lot of help with bathing/dressing/bathroom;Assistance with cooking/housework;Assist for transportation;Help with stairs or ramp for entrance   Can travel by private vehicle   Yes    Equipment Recommendations None recommended by PT (none if SNF, RW if home)  Recommendations for Other Services       Functional Status Assessment Patient has had a recent decline in their functional status and demonstrates the ability to make significant improvements in function in a reasonable and predictable amount of time.     Precautions / Restrictions Precautions Precautions: Fall Restrictions Weight Bearing Restrictions Per Provider Order: No      Mobility  Bed Mobility Overal bed mobility: Needs Assistance Bed Mobility: Supine to Sit, Sit to Supine     Supine to sit: Max assist, HOB elevated Sit to supine: Max assist, HOB elevated   General bed mobility comments: max A due to pain in R knee causing fear of movement.    Transfers Overall transfer level: Needs assistance Equipment used: Rolling walker (2 wheels) Transfers: Sit to/from Stand Sit to Stand: From elevated surface, Mod assist           General transfer comment: Patient requires mod A due to inability to place weight on RLE.    Ambulation/Gait               General Gait Details: unable  to tolerate ambulation due to pain in R knee and orthostatics  Stairs            Wheelchair Mobility     Tilt Bed    Modified Rankin (Stroke Patients Only)       Balance Overall balance assessment: Needs assistance Sitting-balance support: Single extremity supported, Feet supported Sitting balance-Leahy Scale: Fair Sitting balance - Comments: shifting to L to lean away from RLE Postural control: Left lateral lean Standing balance support: Bilateral upper  extremity supported, Reliant on assistive device for balance Standing balance-Leahy Scale: Poor Standing balance comment: unable to weight accept onto RLE due to pain                             Pertinent Vitals/Pain Pain Assessment Pain Assessment: Faces Faces Pain Scale: Hurts whole lot Pain Location: R knee Pain Descriptors / Indicators: Pounding Pain Intervention(s): Limited activity within patient's tolerance, Monitored during session, Repositioned, RN gave pain meds during session    Home Living Family/patient expects to be discharged to:: Private residence Living Arrangements: Spouse/significant other Available Help at Discharge:  (patient unsure if family will be able to assist.) Type of Home: House Home Access: Stairs to enter Entrance Stairs-Rails: Right Entrance Stairs-Number of Steps: 2 from garage, 6 from back deck   Home Layout: Other (Comment) (1.5 level split home) Home Equipment: Shower seat - built in;Grab bars - tub/shower;Grab bars - toilet Additional Comments: Patient's bathroom is accessible for patient's wife.    Prior Function Prior Level of Function : Independent/Modified Independent             Mobility Comments: Patient reports he does not walk with an AD at baseline, walks to his mailbox and back daily ADLs Comments: Patient ind with ADLs at baseline     Extremity/Trunk Assessment   Upper Extremity Assessment Upper Extremity Assessment: Defer to OT evaluation    Lower Extremity Assessment Lower Extremity Assessment: Generalized weakness (unable to assess RLE due to pain in knee. L WFL)    Cervical / Trunk Assessment Cervical / Trunk Assessment: Normal  Communication   Communication Communication: No apparent difficulties Cueing Techniques: Verbal cues  Cognition Arousal: Alert Behavior During Therapy: WFL for tasks assessed/performed Overall Cognitive Status: Within Functional Limits for tasks assessed                                  General Comments: A and O x 4        General Comments General comments (skin integrity, edema, etc.): Patient appears well groomed and nourished.    Exercises Other Exercises Other Exercises: Patient educated on role of PT in acute care setting, safe mobility and transfers, orthostatic taken.   Assessment/Plan    PT Assessment Patient needs continued PT services  PT Problem List Decreased strength;Decreased activity tolerance;Decreased mobility;Decreased balance;Cardiopulmonary status limiting activity;Pain       PT Treatment Interventions DME instruction;Gait training;Stair training;Functional mobility training;Neuromuscular re-education;Balance training;Therapeutic exercise;Therapeutic activities;Patient/family education;Manual techniques;Modalities;Canalith reposition    PT Goals (Current goals can be found in the Care Plan section)  Acute Rehab PT Goals Patient Stated Goal: to not hurt as much PT Goal Formulation: With patient Time For Goal Achievement: 12/05/23 Potential to Achieve Goals: Fair    Frequency Min 1X/week     Co-evaluation  AM-PAC PT 6 Clicks Mobility  Outcome Measure Help needed turning from your back to your side while in a flat bed without using bedrails?: A Lot Help needed moving from lying on your back to sitting on the side of a flat bed without using bedrails?: A Lot Help needed moving to and from a bed to a chair (including a wheelchair)?: A Lot Help needed standing up from a chair using your arms (e.g., wheelchair or bedside chair)?: A Lot Help needed to walk in hospital room?: Total Help needed climbing 3-5 steps with a railing? : Total 6 Click Score: 10    End of Session Equipment Utilized During Treatment: Gait belt Activity Tolerance: Patient limited by pain Patient left: in bed;with call bell/phone within reach;with bed alarm set Nurse Communication: Mobility status;Other (comment) (need  for ortho consult) PT Visit Diagnosis: Unsteadiness on feet (R26.81);Other abnormalities of gait and mobility (R26.89);Muscle weakness (generalized) (M62.81);History of falling (Z91.81);Difficulty in walking, not elsewhere classified (R26.2);Dizziness and giddiness (R42);Pain Pain - Right/Left: Right Pain - part of body: Knee    Time: 9250-9184 PT Time Calculation (min) (ACUTE ONLY): 26 min   Charges:   PT Evaluation $PT Eval Moderate Complexity: 1 Mod PT Treatments $Therapeutic Activity: 8-22 mins PT General Charges $$ ACUTE PT VISIT: 1 Visit         Loyalty Arentz  Leopoldo, PT, DPT Physical Therapist - [place]   11/21/2023, 8:40 AM

## 2023-11-21 NOTE — Evaluation (Signed)
 Occupational Therapy Evaluation Patient Details Name: Alexander Webster MRN: [id number] DOB: 1928-08-19 Today's Date: 11/21/2023   History of Present Illness Patient is a 89 year old male who fell 2 days ago when walking due to feeling dizzy. Family reports patient passed out, no head injury, c/o of L chest wall and R knee pain. Patient was sent to ED on 11/12/23 but left AMA.  X ray: R knee and foot negative for fracture: ribs: L 7th and 8th rib fx. Pmh includes pacemaker, HLD, CAD, dCHF, CKD 3b, aspiration pneumonia, and syncope.   Clinical Impression   Prior to hospital admission, pt was independent and lives with spouse. Pt currently requires modA for bed mobility, and is limited by pain for seated ADL performance. Unable to tolerate sitting EOB to assess orthostatic vitals secondary to R knee pain (pending workup). Anticipate MAX for LB ADLs and MIN for UB ADLs bed level. Pt would benefit from skilled OT services to address noted impairments and functional limitations (see below for any additional details) in order to maximize safety and independence while minimizing falls risk and caregiver burden. Patient will benefit from continued inpatient follow up therapy, <3 hours/day       If plan is discharge home, recommend the following: A lot of help with walking and/or transfers;A lot of help with bathing/dressing/bathroom;Assistance with cooking/housework;Assist for transportation    Functional Status Assessment  Patient has had a recent decline in their functional status and demonstrates the ability to make significant improvements in function in a reasonable and predictable amount of time.  Equipment Recommendations  Other (comment) (defer to next LOC)    Recommendations for Other Services       Precautions / Restrictions Precautions Precautions: Fall Restrictions Weight Bearing Restrictions Per Provider Order: No      Mobility Bed Mobility Overal bed mobility: Needs Assistance Bed  Mobility: Supine to Sit, Sit to Supine     Supine to sit: Mod assist Sit to supine: Mod assist   General bed mobility comments: max A due to pain in R knee causing fear of movement.    Transfers                   General transfer comment: NT      Balance Overall balance assessment: Needs assistance Sitting-balance support: Single extremity supported, Feet supported Sitting balance-Leahy Scale: Fair Sitting balance - Comments: shifting to L to lean away from RLE Postural control: Left lateral lean     Standing balance comment: NT                           ADL either performed or assessed with clinical judgement   ADL Overall ADL's : Needs assistance/impaired Eating/Feeding: Sitting;Set up;Bed level   Grooming: Set up;Bed level   Upper Body Bathing: Bed level;Minimal assistance   Lower Body Bathing: Bed level;Maximal assistance   Upper Body Dressing : Bed level;Sitting;Minimal assistance   Lower Body Dressing: Maximal assistance;Sitting/lateral leans;Sit to/from Market Researcher Details (indicate cue type and reason): NT. Pt unable to tolerate sitting EOB secondary to knee pain Toileting- Clothing Manipulation and Hygiene: Total assistance;Bed level       Functional mobility during ADLs: Minimal assistance General ADL Comments: Pt significantly limited by pain in R knee on eval. Able to move from supine > EOB with MOD A and increased time with rest breaks due to pain. Could not tolerate ADL seated EOB  or orthostatics at this time with knee pain increasing. ADLs based on clinical judgement     Vision Baseline Vision/History: 1 Wears glasses       Perception         Praxis         Pertinent Vitals/Pain Pain Assessment Pain Assessment: Faces Faces Pain Scale: Hurts whole lot Pain Location: R knee Pain Descriptors / Indicators: Shooting, Sharp, Grimacing, Guarding, Discomfort Pain Intervention(s): Limited activity within  patient's tolerance, Monitored during session     Extremity/Trunk Assessment Upper Extremity Assessment Upper Extremity Assessment: Generalized weakness   Lower Extremity Assessment Lower Extremity Assessment: Generalized weakness   Cervical / Trunk Assessment Cervical / Trunk Assessment: Normal   Communication Communication Communication: No apparent difficulties   Cognition Arousal: Alert Behavior During Therapy: WFL for tasks assessed/performed Overall Cognitive Status: Within Functional Limits for tasks assessed                                 General Comments: A and O x 4     General Comments  Supine 102/53, supine after sitting EOB 105/71    Exercises     Shoulder Instructions      Home Living Family/patient expects to be discharged to:: Private residence Living Arrangements: Spouse/significant other Available Help at Discharge:  (patient unsure if family will be able to assist.) Type of Home: House Home Access: Stairs to enter [employer] of Steps: 2 from garage, 6 from back deck Entrance Stairs-Rails: Right Home Layout: Other (Comment) (1.5 level split home)     Bathroom Shower/Tub: Producer, Television/film/video: Standard     Home Equipment: Shower seat - built in;Grab bars - tub/shower;Grab bars - toilet   Additional Comments: Patient's bathroom is accessible for patient's wife.      Prior Functioning/Environment Prior Level of Function : Independent/Modified Independent             Mobility Comments: Patient reports he does not walk with an AD at baseline, walks to his mailbox and back daily ADLs Comments: Patient ind with ADLs at baseline        OT Problem List: Decreased strength;Decreased range of motion;Decreased activity tolerance;Impaired balance (sitting and/or standing);Pain;Increased edema      OT Treatment/Interventions: Self-care/ADL training;Therapeutic exercise;Energy conservation;DME and/or AE  instruction;Therapeutic activities;Patient/family education;Balance training    OT Goals(Current goals can be found in the care plan section) Acute Rehab OT Goals OT Goal Formulation: With patient Time For Goal Achievement: 12/05/23 Potential to Achieve Goals: Fair ADL Goals Pt Will Perform Upper Body Bathing: sitting;with min assist Pt Will Transfer to Toilet: with min assist;bedside commode Pt Will Perform Toileting - Clothing Manipulation and hygiene: with min assist;sit to/from stand;sitting/lateral leans Additional ADL Goal #1: Pt will tolerate sitting EOB > 5 mins with VSS to improve performance in ADLs and mobility  OT Frequency: Min 1X/week       AM-PAC OT 6 Clicks Daily Activity     Outcome Measure Help from another person eating meals?: None Help from another person taking care of personal grooming?: None Help from another person toileting, which includes using toliet, bedpan, or urinal?: A Lot Help from another person bathing (including washing, rinsing, drying)?: A Lot Help from another person to put on and taking off regular upper body clothing?: A Little Help from another person to put on and taking off regular lower body clothing?: A Lot 6 Click Score:  17   End of Session Nurse Communication: Mobility status  Activity Tolerance: Patient limited by pain Patient left: in bed;with call bell/phone within reach;with bed alarm set;with family/visitor present  OT Visit Diagnosis: Unsteadiness on feet (R26.81);Pain;Other abnormalities of gait and mobility (R26.89);Muscle weakness (generalized) (M62.81) Pain - Right/Left: Right Pain - part of body: Knee                Time: 8555-8478 OT Time Calculation (min): 37 min Charges:  OT General Charges $OT Visit: 1 Visit OT Evaluation $OT Eval Low Complexity: 1 Low  Viha Kriegel L. Baya Lentz, OTR/L  11/21/23, 4:05 PM

## 2023-11-21 NOTE — Progress Notes (Signed)
 Progress Note    Alexander Webster   FMW:[id number]  DOB: 1928-04-20  DOA: 11/20/2023     0 PCP: Christi Vannie PARAS, MD  Initial CC: Fall, right knee pain, left chest wall pain  Hospital Course: Alexander Webster is a 89 y.o. male with PMH CHB s/p Medtronic Micra AV leadless (07/07/21), HLD, CAD, dCHF, CKD-3b, aspiration pneumonia, syncope, who presented with a fall and syncope.  Patient reported approximately 2 days prior to admission he felt dizzy when standing and walking resulting in a fall.  He endorses that he has been feeling more dizzy recently especially when first standing which gets better after he does not move some. No unilateral numbness or weakness in extremities.  No facial droop or slurred speech.  Of note, pt had dizziness and was sent to ED by cardiologist 11/12/2023.  CTA of head was recommended, however, pt did not wish to stay for further workup and left AMA.   Since his recent fall he has had ongoing right knee pain and swelling as well.  Also having left-sided chest wall pains. Multiple imaging studies performed on workup in the ER.  He was found to have mildly displaced fractures of the left seventh and eighth ribs.  He was admitted for further monitoring and evaluation regarding his syncope and fall.  Interval History:  Resting in bed when seen this morning.  Biggest complaint is ongoing right knee pain which has had more swelling since admission.  Still has tender left chest wall as well from underlying rib fractures. Orthostatics were positive when checked this morning.  Assessment and Plan: * Syncope - Patient has been endorsing dizziness when standing and first ambulating recently, raising concern for orthostasis -Other considered differential and etiology would be cardiac given history of CHB with pacemaker placement 2022 -Follow-up pacemaker interrogation - Orthostatic blood pressure performed 11/21/2023 and grossly positive (110/60 >> 95/56 >> 61/51) -Starting on  IV fluids - Do not see any antihypertensives or rate control agents on home med list -Repeat orthostatics after fluid resuscitation for at least 24 hours  Right knee pain - Notable swelling and probable effusion on exam -Right knee x-ray on admission negative for fracture or dislocation.  Showed moderate tricompartmental arthrosis -Given worsening pain and swelling today, will pursue with CT for further eval  Left rib fracture - Left 7th and 8th rib fractures s/p fall - Scheduled Tylenol  - Lidocaine  patch - Robaxin  PRN -Fentanyl  for severe pain  Leukocytosis - Low concern for infection at this time - Suspected reactive from fall and stress response  Aspiration, chronic pulmonary - CT chest shows bronchial wall thickening and plugging throughout bilateral bases with scarring or atelectasis of left lung base concerning for chronic aspiration - Continue Augmentin  for now - Follow-up SLP eval  Myocardial injury - Flat troponin leak considered from mild demand from fall and pain - Follow-up echo - EKG with ventricular paced rhythm, otherwise unhelpful -Patient has no actual chest pain, mostly left-sided chest wall pain from rib fractures  CAD (coronary artery disease) - Continue Plavix , Zocor   Fall at home, initial encounter - Presumed due to orthostatic hypotension - Continue working with PT as able  Chronic diastolic CHF (congestive heart failure) (HCC) - No signs or symptoms of exacerbation nor any volume overload - Last echo August 2022.  EF 55 to 60%, normal diastolic parameters, no RWMA. - Follow-up repeat echo  Stage 3b chronic kidney disease (HCC) - patient has history of CKD3b. Baseline creat ~ 1.6,  eGFR~ 40 - at baseline   HLD (hyperlipidemia) - Continue Zocor   Chronic obstructive pulmonary disease (HCC) - No wheezing or signs of exacerbation at this time - Continue PRN breathing treatments   Old records reviewed in assessment of this  patient  Antimicrobials: Augmentin  11/20/2023 >> current  DVT prophylaxis:  enoxaparin  (LOVENOX ) injection 30 mg Start: 11/20/23 2200   Code Status:   Code Status: Full Code  Mobility Assessment (Last 72 Hours)     Mobility Assessment     Row Name 11/21/23 0836 11/20/23 2200 11/20/23 2100       Does patient have an order for bedrest or is patient medically unstable -- No - Continue assessment No - Continue assessment     What is the highest level of mobility based on the progressive mobility assessment? Level 3 (Stands with assist) - Balance while standing  and cannot march in place Level 3 (Stands with assist) - Balance while standing  and cannot march in place Level 3 (Stands with assist) - Balance while standing  and cannot march in place     Is the above level different from baseline mobility prior to current illness? -- Yes - Recommend PT order Yes - Recommend PT order              Barriers to discharge: none Disposition Plan: Pending further PT evals HH orders placed:  Status is: Observation  Objective: Blood pressure 110/60, pulse 84, temperature 98.4 F (36.9 C), temperature source Oral, resp. rate 17, height 5' 7 (1.702 m), weight 70.9 kg, SpO2 95%.  Examination:  Physical Exam Constitutional:      General: He is not in acute distress.    Appearance: Normal appearance.  HENT:     Head: Normocephalic and atraumatic.     Mouth/Throat:     Mouth: Mucous membranes are moist.  Eyes:     Extraocular Movements: Extraocular movements intact.  Cardiovascular:     Rate and Rhythm: Normal rate and regular rhythm.  Pulmonary:     Effort: Pulmonary effort is normal. No respiratory distress.     Breath sounds: Normal breath sounds. No wheezing.  Abdominal:     General: Bowel sounds are normal. There is no distension.     Palpations: Abdomen is soft.     Tenderness: There is no abdominal tenderness.  Musculoskeletal:        General: Swelling (Right knee with  decreased ROM due to swelling) and tenderness (Left chest wall and right knee) present.     Cervical back: Normal range of motion and neck supple.     Right lower leg: No edema.     Left lower leg: No edema.  Skin:    General: Skin is warm and dry.  Neurological:     General: No focal deficit present.     Mental Status: He is alert.  Psychiatric:        Mood and Affect: Mood normal.        Behavior: Behavior normal.      Consultants:    Procedures:    Data Reviewed: Results for orders placed or performed during the hospital encounter of 11/20/23 (from the past 24 hours)  CBC with Differential     Status: Abnormal   Collection Time: 11/20/23  3:35 PM  Result Value Ref Range   WBC 14.4 (H) 4.0 - 10.5 K/uL   RBC 4.61 4.22 - 5.81 MIL/uL   Hemoglobin 14.5 13.0 - 17.0 g/dL   HCT 44.2  39.0 - 52.0 %   MCV 95.9 80.0 - 100.0 fL   MCH 31.5 26.0 - 34.0 pg   MCHC 32.8 30.0 - 36.0 g/dL   RDW 85.8 88.4 - 84.4 %   Platelets 207 150 - 400 K/uL   nRBC 0.0 0.0 - 0.2 %   Neutrophils Relative % 81 %   Neutro Abs 11.8 (H) 1.7 - 7.7 K/uL   Lymphocytes Relative 7 %   Lymphs Abs 1.0 0.7 - 4.0 K/uL   Monocytes Relative 11 %   Monocytes Absolute 1.5 (H) 0.1 - 1.0 K/uL   Eosinophils Relative 0 %   Eosinophils Absolute 0.0 0.0 - 0.5 K/uL   Basophils Relative 0 %   Basophils Absolute 0.0 0.0 - 0.1 K/uL   Immature Granulocytes 1 %   Abs Immature Granulocytes 0.07 0.00 - 0.07 K/uL  Comprehensive metabolic panel     Status: Abnormal   Collection Time: 11/20/23  3:35 PM  Result Value Ref Range   Sodium 138 135 - 145 mmol/L   Potassium 4.4 3.5 - 5.1 mmol/L   Chloride 105 98 - 111 mmol/L   CO2 21 (L) 22 - 32 mmol/L   Glucose, Bld 97 70 - 99 mg/dL   BUN 38 (H) 8 - 23 mg/dL   Creatinine, Ser 8.08 (H) 0.61 - 1.24 mg/dL   Calcium 9.0 8.9 - 89.6 mg/dL   Total Protein 7.0 6.5 - 8.1 g/dL   Albumin 3.9 3.5 - 5.0 g/dL   AST 17 15 - 41 U/L   ALT 12 0 - 44 U/L   Alkaline Phosphatase 44 38 - 126 U/L    Total Bilirubin 1.6 (H) 0.0 - 1.2 mg/dL   GFR, Estimated 32 (L) >60 mL/min   Anion gap 12 5 - 15  Troponin I (High Sensitivity)     Status: Abnormal   Collection Time: 11/20/23  3:35 PM  Result Value Ref Range   Troponin I (High Sensitivity) 27 (H) <18 ng/L  Brain natriuretic peptide     Status: Abnormal   Collection Time: 11/20/23  3:49 PM  Result Value Ref Range   B Natriuretic Peptide 301.7 (H) 0.0 - 100.0 pg/mL  Urinalysis, Routine w reflex microscopic -Urine, Clean Catch     Status: Abnormal   Collection Time: 11/20/23  7:34 PM  Result Value Ref Range   Color, Urine YELLOW (A) YELLOW   APPearance CLEAR (A) CLEAR   Specific Gravity, Urine 1.027 1.005 - 1.030   pH 5.0 5.0 - 8.0   Glucose, UA 50 (A) NEGATIVE mg/dL   Hgb urine dipstick MODERATE (A) NEGATIVE   Bilirubin Urine NEGATIVE NEGATIVE   Ketones, ur NEGATIVE NEGATIVE mg/dL   Protein, ur 30 (A) NEGATIVE mg/dL   Nitrite NEGATIVE NEGATIVE   Leukocytes,Ua NEGATIVE NEGATIVE   RBC / HPF 0-5 0 - 5 RBC/hpf   WBC, UA 0-5 0 - 5 WBC/hpf   Bacteria, UA RARE (A) NONE SEEN   Squamous Epithelial / HPF 0-5 0 - 5 /HPF   Mucus PRESENT   Troponin I (High Sensitivity)     Status: Abnormal   Collection Time: 11/20/23  7:38 PM  Result Value Ref Range   Troponin I (High Sensitivity) 30 (H) <18 ng/L  Expectorated Sputum Assessment w Gram Stain, Rflx to Resp Cult     Status: None   Collection Time: 11/20/23  7:38 PM   Specimen: Expectorated Sputum  Result Value Ref Range   Specimen Description EXPECTORATED SPUTUM  Special Requests NONE    Sputum evaluation      THIS SPECIMEN IS ACCEPTABLE FOR SPUTUM CULTURE Performed at [facility], [address] [place], [place] [address]    Report Status 11/20/2023 FINAL   Troponin I (High Sensitivity)     Status: Abnormal   Collection Time: 11/20/23  9:54 PM  Result Value Ref Range   Troponin I (High Sensitivity) 33 (H) <18 ng/L  Troponin I (High Sensitivity)     Status: Abnormal    Collection Time: 11/21/23 12:55 AM  Result Value Ref Range   Troponin I (High Sensitivity) 34 (H) <18 ng/L  Lipid panel     Status: None   Collection Time: 11/21/23  6:21 AM  Result Value Ref Range   Cholesterol 106 0 - 200 mg/dL   Triglycerides 66 <849 mg/dL   HDL 45 >59 mg/dL   Total CHOL/HDL Ratio 2.4 RATIO   VLDL 13 0 - 40 mg/dL   LDL Cholesterol 48 0 - 99 mg/dL  Basic metabolic panel     Status: Abnormal   Collection Time: 11/21/23  6:24 AM  Result Value Ref Range   Sodium 137 135 - 145 mmol/L   Potassium 4.2 3.5 - 5.1 mmol/L   Chloride 107 98 - 111 mmol/L   CO2 23 22 - 32 mmol/L   Glucose, Bld 112 (H) 70 - 99 mg/dL   BUN 33 (H) 8 - 23 mg/dL   Creatinine, Ser 8.29 (H) 0.61 - 1.24 mg/dL   Calcium 8.6 (L) 8.9 - 10.3 mg/dL   GFR, Estimated 37 (L) >60 mL/min   Anion gap 7 5 - 15  CBC     Status: Abnormal   Collection Time: 11/21/23  6:24 AM  Result Value Ref Range   WBC 11.3 (H) 4.0 - 10.5 K/uL   RBC 4.12 (L) 4.22 - 5.81 MIL/uL   Hemoglobin 13.1 13.0 - 17.0 g/dL   HCT 61.7 (L) 60.9 - 47.9 %   MCV 92.7 80.0 - 100.0 fL   MCH 31.8 26.0 - 34.0 pg   MCHC 34.3 30.0 - 36.0 g/dL   RDW 85.9 88.4 - 84.4 %   Platelets 182 150 - 400 K/uL   nRBC 0.0 0.0 - 0.2 %    I have reviewed pertinent nursing notes, vitals, labs, and images as necessary. I have ordered labwork to follow up on as indicated.  I have reviewed the last notes from staff over past 24 hours. I have discussed patient's care plan and test results with nursing staff, CM/SW, and other staff as appropriate.  Time spent: Greater than 50% of the 55 minute visit was spent in counseling/coordination of care for the patient as laid out in the A&P.   LOS: 0 days   Alm Apo, MD [facility] 11/21/2023, 10:55 AM

## 2023-11-21 NOTE — Assessment & Plan Note (Signed)
-   Notable swelling and probable effusion on exam -Right knee x-ray on admission negative for fracture or dislocation.  Showed moderate tricompartmental arthrosis -Given worsening pain and swelling today, will pursue with CT for further eval

## 2023-11-21 NOTE — Assessment & Plan Note (Signed)
 Continue Zocor

## 2023-11-21 NOTE — TOC Initial Note (Signed)
 Transition of Care [facility]) - Initial/Assessment Note    Patient Details  Name: Alexander Webster MRN: [id number] Date of Birth: August 12, 1928  Transition of Care [facility] CM/SW Contact:    Mayzie Caughlin E Sritha Chauncey, LCSW Phone Number: 11/21/2023, 5:22 PM  Clinical Narrative:                 CSW spoke with patient regarding SNF recommendation. Patient is from home with wife. PCP is Dr. Lenon. Patient confirmed home address. Patient states he has a walker at home already. Patient states he understands SNF recommendation, but does not want SNF. He feels comfortable returning home with his spouse. He is agreeable to home health, declined home health history or agency preferences. Referral accepted by [facility] with [facility].  Expected Discharge Plan: Home w Home Health Services Barriers to Discharge: Continued Medical Work up   Patient Goals and CMS Choice Patient states their goals for this hospitalization and ongoing recovery are:: declined SNF CMS Medicare.gov Compare Post Acute Care list provided to:: Patient Choice offered to / list presented to : Patient      Expected Discharge Plan and Services       Living arrangements for the past 2 months: Single Family Home                           HH Arranged: PT, OT, RN, Nurse's Aide HH Agency: [facility] Date [facility] Agency Contacted: 11/21/23   Representative spoke with at [facility] Agency: Channing  Prior Living Arrangements/Services Living arrangements for the past 2 months: Single Family Home Lives with:: Spouse Patient language and need for interpreter reviewed:: Yes Do you feel safe going back to the place where you live?: Yes      Need for Family Participation in Patient Care: Yes (Comment) Care giver support system in place?: Yes (comment) Current home services: DME Criminal Activity/Legal Involvement Pertinent to Current Situation/Hospitalization: No - Comment as needed  Activities of Daily Living   ADL Screening (condition  at time of admission) Independently performs ADLs?: Yes (appropriate for developmental age) Is the patient deaf or have difficulty hearing?: No Does the patient have difficulty seeing, even when wearing glasses/contacts?: No Does the patient have difficulty concentrating, remembering, or making decisions?: No  Permission Sought/Granted Permission sought to share information with : Facility Industrial/product Designer granted to share information with : Yes, Verbal Permission Granted     Permission granted to share info w AGENCY: Home Health agencies        Emotional Assessment       Orientation: : Oriented to Self, Oriented to Place, Oriented to  Time, Oriented to Situation Alcohol / Substance Use: Not Applicable Psych Involvement: No (comment)  Admission diagnosis:  Syncope and collapse [R55] Syncope [R55] Closed fracture of multiple ribs of left side, initial encounter [S22.42XA] Patient Active Problem List   Diagnosis Date Noted   Leukocytosis 11/21/2023   Right knee pain 11/21/2023   Syncope 11/20/2023   HLD (hyperlipidemia) 11/20/2023   Chronic diastolic CHF (congestive heart failure) (HCC) 11/20/2023   Fall at home, initial encounter 11/20/2023   CAD (coronary artery disease) 11/20/2023   Myocardial injury 11/20/2023   Left rib fracture 11/20/2023   Complete heart block (HCC)    Cardiogenic shock (HCC)    Chronic obstructive pulmonary disease (HCC)    Stage 3b chronic kidney disease (HCC)    Coronary artery disease due to lipid rich plaque  Symptomatic bradycardia 07/06/2021   Gastroesophageal reflux disease    Aspiration, chronic pulmonary 03/17/2021   Acute respiratory failure (HCC) 03/17/2021   Cough 08/28/2014   Vasomotor rhinitis 08/28/2014   PCP:  Christi Vannie PARAS, MD Pharmacy:   [facility] - [place], [place] - [phone] [address] [place] [address] Phone: [phone] Fax:  [phone]     Social Drivers of Health (SDOH) Social History: SDOH Screenings   Food Insecurity: No Food Insecurity (11/20/2023)  Housing: Low Risk  (11/20/2023)  Transportation Needs: No Transportation Needs (11/20/2023)  Utilities: Not At Risk (11/20/2023)  Financial Resource Strain: Low Risk  (09/22/2023)   Received from [facility]  Social Connections: Socially Integrated (11/20/2023)  Tobacco Use: Medium Risk (11/20/2023)   SDOH Interventions:     Readmission Risk Interventions    11/21/2023    5:19 PM  Readmission Risk Prevention Plan  Post Dischage Appt Complete  Medication Screening Complete  Transportation Screening Complete

## 2023-11-21 NOTE — Assessment & Plan Note (Addendum)
-   Left 7th and 8th rib fractures s/p fall - Scheduled Tylenol - Lidocaine patch - Robaxin PRN -Fentanyl for severe pain

## 2023-11-22 ENCOUNTER — Inpatient Hospital Stay
Admit: 2023-11-22 | Discharge: 2023-11-22 | Disposition: A | Discharge status: Home / Self Care | Payer: Medicare Other | Attending: Internal Medicine | Admitting: Internal Medicine

## 2023-11-22 DIAGNOSIS — R55 Syncope and collapse: Secondary | ICD-10-CM | POA: Diagnosis not present

## 2023-11-22 DIAGNOSIS — W19XXXA Unspecified fall, initial encounter: Secondary | ICD-10-CM | POA: Diagnosis not present

## 2023-11-22 DIAGNOSIS — Y92009 Unspecified place in unspecified non-institutional (private) residence as the place of occurrence of the external cause: Secondary | ICD-10-CM | POA: Diagnosis not present

## 2023-11-22 LAB — BASIC METABOLIC PANEL
Anion gap: 9 (ref 5–15)
BUN: 35 mg/dL — ABNORMAL HIGH (ref 8–23)
CO2: 22 mmol/L (ref 22–32)
Calcium: 7.9 mg/dL — ABNORMAL LOW (ref 8.9–10.3)
Chloride: 107 mmol/L (ref 98–111)
Creatinine, Ser: 1.6 mg/dL — ABNORMAL HIGH (ref 0.61–1.24)
GFR, Estimated: 40 mL/min — ABNORMAL LOW (ref >60)
Glucose, Bld: 106 mg/dL — ABNORMAL HIGH (ref 70–99)
Potassium: 4.1 mmol/L (ref 3.5–5.1)
Sodium: 138 mmol/L (ref 135–145)

## 2023-11-22 LAB — CBC WITH DIFFERENTIAL/PLATELET
Abs Immature Granulocytes: 0.08 10*3/uL — ABNORMAL HIGH (ref 0.00–0.07)
Basophils Absolute: 0 10*3/uL (ref 0.0–0.1)
Basophils Relative: 0 %
Eosinophils Absolute: 0.1 10*3/uL (ref 0.0–0.5)
Eosinophils Relative: 1 %
HCT: 35.2 % — ABNORMAL LOW (ref 39.0–52.0)
Hemoglobin: 11.6 g/dL — ABNORMAL LOW (ref 13.0–17.0)
Immature Granulocytes: 1 %
Lymphocytes Relative: 8 %
Lymphs Abs: 0.8 10*3/uL (ref 0.7–4.0)
MCH: 30.9 pg (ref 26.0–34.0)
MCHC: 33 g/dL (ref 30.0–36.0)
MCV: 93.9 fL (ref 80.0–100.0)
Monocytes Absolute: 1.6 10*3/uL — ABNORMAL HIGH (ref 0.1–1.0)
Monocytes Relative: 16 %
Neutro Abs: 7.5 10*3/uL (ref 1.7–7.7)
Neutrophils Relative %: 74 %
Platelets: 175 10*3/uL (ref 150–400)
RBC: 3.75 MIL/uL — ABNORMAL LOW (ref 4.22–5.81)
RDW: 14.2 % (ref 11.5–15.5)
WBC: 10.1 10*3/uL (ref 4.0–10.5)
nRBC: 0 % (ref 0.0–0.2)

## 2023-11-22 LAB — ECHOCARDIOGRAM COMPLETE
Height: 67 in
S' Lateral: 2.6 cm
Weight: 2500.9 [oz_av]

## 2023-11-22 LAB — HEMOGLOBIN A1C
Hgb A1c MFr Bld: 5.9 % — ABNORMAL HIGH (ref 4.8–5.6)
Mean Plasma Glucose: 123 mg/dL

## 2023-11-22 LAB — MAGNESIUM: Magnesium: 1.9 mg/dL (ref 1.7–2.4)

## 2023-11-22 MED ORDER — AMOXICILLIN-POT CLAVULANATE 500-125 MG PO TABS
1.0000 | ORAL_TABLET | Freq: Two times a day (BID) | ORAL | Status: AC
  Administered 2023-11-22 – 2023-11-23 (×3): 1 via ORAL
  Filled 2023-11-22 (×3): qty 1

## 2023-11-22 NOTE — Progress Notes (Signed)
 Progress Note   Patient: Alexander Webster FMW:[id number] DOB: July 25, 1928 DOA: 11/20/2023     1 DOS: the patient was seen and examined on 11/22/2023   Brief hospital course: 89yo with h/o pacemaker (Medtronic Micra AV leadless (07/07/21), HLD, CAD, dCHF, CKD-3b, and aspiration pneumonia who presented on 1/4 with a fall and syncope.  Of note, pt had dizziness and was sent to ED by cardiologist 11/12/2023.  CTA of head was recommended, however, pt did not wish to stay for further workup and left AMA. Since his recent fall he has had ongoing right knee pain and swelling as well as left-sided chest wall pains.  Multiple imaging studies performed and he was found to have mildly displaced fractures of the left seventh and eighth ribs.  Assessment and Plan:    Fall at home Patient has been endorsing dizziness when standing and first ambulating recently, raising concern for orthostasis Orthostatic blood pressure performed 11/21/2023 and grossly positive (110/60 >> 95/56 >> 61/51) Started on IV fluids (now off) Repeat orthostatics 1/7 AM Consider addition of midodrine Fall resulting in R knee injury, L rib fractures Patient reports this was a mechanical fall (tripped over his wife, who also fell) He has been recommended for SNF; he initially declined but now agrees that SNF rehab is needed   Right knee pain Notable swelling and probable effusion on exam Right knee x-ray on admission negative for fracture or dislocation.  Showed moderate tricompartmental arthrosis CT performed without obvious fracture but with lipohemarthrosis  Tibial plateau fracture, per Dr. Maryrose - subtle and nonoperative, needs knee immobilizer WBAT Will need outpatient f/u in [facility] - if not improving, knee replacement may need to be considered    Left rib fracture Left 7th and 8th rib fractures s/p fall Scheduled Tylenol  Lidocaine  patch Robaxin  PRN Fentanyl  for severe pain Discussed CPR and he agrees that DNR is most  appropriate   Aspiration, chronic pulmonary CT chest shows bronchial wall thickening and plugging throughout bilateral bases with scarring or atelectasis of left lung base concerning for chronic aspiration Continue Augmentin  for now SLP evaluated and provided recommendations   CAD (coronary artery disease) Continue Plavix , Zocor  Mild troponin leak on presentation, low suspicion for ACS   Chronic diastolic CHF (congestive heart failure)  No signs or symptoms of exacerbation nor any volume overload Last echo August 2022.  EF 55 to 60%, normal diastolic parameters, no RWMA. Repeat echo with preserved EF, indeterminate diastolic function, no significant findings   Stage 3b chronic kidney disease  Appears to be stable at this time Attempt to avoid nephrotoxic medications Recheck BMP in AM    HLD (hyperlipidemia) Continue Zocor    Chronic obstructive pulmonary disease (HCC) No wheezing or signs of exacerbation at this time Continue PRN breathing treatments and daily Singulair     Consultants: Orthopedics (telephone only) PT OT SLP TOC team  Procedures: None  Antibiotics: Augmentin  1/4-8   30 Day Unplanned Readmission Risk Score    Flowsheet Row ED to Hosp-Admission (Current) from 11/20/2023 in [facility]  30 Day Unplanned Readmission Risk Score (%) 16.37 Filed at 11/22/2023 0801       This score is the patient's risk of an unplanned readmission within 30 days of being discharged (0 -100%). The score is based on dignosis, age, lab data, medications, orders, and past utilization.   Low:  0-14.9   Medium: 15-21.9   High: 22-29.9   Extreme: 30 and above  Subjective: Feeling ok.  Agrees that he will need SNF rehab.  R knee and L ribs are painful.   Objective: Vitals:   11/22/23 0430 11/22/23 0854  BP: 117/62 (!) 108/55  Pulse: 79 82  Resp: 20 18  Temp: 98 F (36.7 C) (!) 97.4 F (36.3 C)  SpO2: 96% 96%     Intake/Output Summary (Last 24 hours) at 11/22/2023 1548 Last data filed at 11/22/2023 0856 Gross per 24 hour  Intake 1340.95 ml  Output 600 ml  Net 740.95 ml   Filed Weights   11/20/23 2130 11/21/23 0422 11/22/23 0432  Weight: 70.9 kg 70.9 kg 60.9 kg    Exam:  General:  Appears calm and comfortable and is in NAD Eyes:  EOMI, normal lids, iris ENT:  grossly normal hearing, lips & tongue, mmm Neck:  no LAD, masses or thyromegaly Cardiovascular:  RRR, no m/r/g. No LE edema.  Respiratory:   CTA bilaterally with no wheezes/rales/rhonchi.  Normal respiratory effort. Abdomen:  soft, NT, ND Skin:  no rash or induration seen on limited exam Musculoskeletal:  R knee with significant effusion Psychiatric:  pleasant mood and affect, speech fluent and appropriate, AOx3 Neurologic:  CN 2-12 grossly intact, moves all extremities in coordinated fashion  Data Reviewed: I have reviewed the patient's lab results since admission.  Pertinent labs for today include:   BUN 35/Creatinine 1.6/GFR 40 WBC 10.1 Hgb 11.6 Sputum culture pending    Family Communication: Nephew was present throughout evaluation  Disposition: Status is: Inpatient Remains inpatient appropriate because: ongoing evaluation and management     Time spent: 50 minutes  Unresulted Labs (From admission, onward)     Start     Ordered   11/22/23 0500  Basic metabolic panel  Daily,   R     Question:  Specimen collection method  Answer:  Lab=Lab collect   11/21/23 1126   11/22/23 0500  CBC with Differential/Platelet  Daily,   R     Question:  Specimen collection method  Answer:  Lab=Lab collect   11/21/23 1126   11/22/23 0500  Magnesium  Daily,   R     Question:  Specimen collection method  Answer:  Lab=Lab collect   11/21/23 1126             Author: Delon Herald, MD 11/22/2023 3:48 PM  For on call review [email or web address].

## 2023-11-22 NOTE — Plan of Care (Signed)
  Problem: Education: Goal: Knowledge of General Education information will improve Description: Including pain rating scale, medication(s)/side effects and non-pharmacologic comfort measures Outcome: Not Progressing   Problem: Health Behavior/Discharge Planning: Goal: Ability to manage health-related needs will improve Outcome: Not Progressing   Problem: Clinical Measurements: Goal: Ability to maintain clinical measurements within normal limits will improve Outcome: Not Progressing Goal: Will remain free from infection Outcome: Not Progressing Goal: Diagnostic test results will improve Outcome: Not Progressing Goal: Respiratory complications will improve Outcome: Not Progressing Goal: Cardiovascular complication will be avoided Outcome: Not Progressing   Problem: Activity: Goal: Risk for activity intolerance will decrease Outcome: Not Progressing   Problem: Nutrition: Goal: Adequate nutrition will be maintained Outcome: Not Progressing   Problem: Coping: Goal: Level of anxiety will decrease Outcome: Not Progressing   Problem: Elimination: Goal: Will not experience complications related to bowel motility Outcome: Not Progressing Goal: Will not experience complications related to urinary retention Outcome: Not Progressing   Problem: Pain Management: Goal: General experience of comfort will improve Outcome: Not Progressing   Problem: Safety: Goal: Ability to remain free from injury will improve Outcome: Not Progressing   Problem: Skin Integrity: Goal: Risk for impaired skin integrity will decrease Outcome: Not Progressing   Problem: Activity: Goal: Ability to tolerate increased activity will improve Outcome: Not Progressing   Problem: Clinical Measurements: Goal: Ability to maintain a body temperature in the normal range will improve Outcome: Not Progressing   Problem: Respiratory: Goal: Ability to maintain adequate ventilation will improve Outcome: Not  Progressing Goal: Ability to maintain a clear airway will improve Outcome: Not Progressing

## 2023-11-22 NOTE — Discharge Instructions (Signed)
 ORTHOPEDIC DISCHARGE INSTRUCTIONS  Follow Up Appointment:  Follow-up in the office in 10-14 days.  Please contact the [facility] at [phone]  to schedule your follow-up appointment.  Dressing and cast care instructions:  No dressing is needed.  Weight-bearing status:  You are weightbearing as tolerated on the RIGHT LOWER EXTREMITY with your walker/crutches.  Wear your knee immobilizer while ambulating.   Diet:   You may resume your regular diet as tolerated.  Begin with clear liquids and slowly advance to your normal diet.  Medications:   Take your medicines as prescribed. You may have written prescriptions or your prescriptions may have been E-prescribed to your pharmacy. If you were given prescriptions for any blood thinners, it is a very important that you take these medications as prescribed to prevent blood clots.  General instructions:  Elevate the affected extremity to control pain and swelling. Apply ice packs to the affected area to control pain and swelling.  Surgery and pain medications may lead to constipation. It is easier to prevent this than it is to treat it. We recommend MiraLAX  or Senna/Senakot for laxatives and Colace as a stool softener as needed after surgery. Drink plenty of fluids to stay well-hydrated. Consult your local pharmacist for other treatment recommendations.   Please perform deep breathing exercises every hour to increase the airflow in your lungs which could predispose you to running a low-grade fever and increase your risk of pneumonia.

## 2023-11-22 NOTE — Consult Note (Signed)
 ORTHOPEDIC CONSULTATION  ARTEZ REGIS [id number]  11/22/2023  CC:  Chief Complaint  Patient presents with   Fall    History of Present IlIness: The patient is a 89 y.o. male who had a syncopal episode and a ground-level fall 2 days ago.  He was admitted for workup of his knee pain and syncope.  Orthopedics was consulted on 11/22/2023 for treatment recommendations for his possible right knee occult fracture.  Admission history 11/20/2023 for completeness:Alexander Webster is a 89 y.o. male with medical history significant of complete heart block, s/p pacemaker, HLD, CAD, dCHF, CKD-3b, aspiration pneumonia, syncope, who presents with fall and syncope.   Patient reports that 2 days ago he was walking when he felt dizzy and fell. Family reported that pt passed out shortly.  No head injury. She complains of pain in left chest wall and right knee. He has dizziness and feeling of room spinning.  No unilateral numbness or weakness in extremities.  No facial droop or slurred speech. Patient has cough with white mucus production, no chest pain, palpitation, fever or chills.  Denies choking episode.  No nausea, vomiting, diarrhea or abdominal pain.  Denies symptoms of UTI. Of note, pt had dizziness and was sent to ED by cardiologist 11/12/2023.  CTA of head was recommended, however,pt did not wish to stay for further workup and left AMA.   PMH:  Past Medical History:  Diagnosis Date   Dysrhythmia    possible cause of syncope-    Leg fracture    Syncope     SH:  Past Surgical History:  Procedure Laterality Date   HERNIA REPAIR Bilateral 05-24-12   Dr Dellie   LEG SURGERY Right 02/08/2015   LOOP RECORDER INSERTION N/A 10/30/2020   Procedure: LOOP RECORDER INSERTION;  Surgeon: Ammon Blunt, MD;  Location: [facility] INVASIVE CV LAB;  Service: Cardiovascular;  Laterality: N/A;   MUSCLE REPAIR Right    calf   PACEMAKER LEADLESS INSERTION N/A 07/07/2021   Procedure: PACEMAKER LEADLESS INSERTION;  Surgeon:  Ammon Blunt, MD;  Location: [facility] INVASIVE CV LAB;  Service: Cardiovascular;  Laterality: N/A;   ruptured disc     ruptured disc repair   TEMPORARY PACEMAKER N/A 07/06/2021   Procedure: TEMPORARY PACEMAKER;  Surgeon: Ammon Blunt, MD;  Location: [facility] INVASIVE CV LAB;  Service: Cardiovascular;  Laterality: N/A;    ALL:  Allergies  Allergen Reactions   Percocet [Oxycodone-Acetaminophen ] Itching    Itching   Vicodin [Hydrocodone-Acetaminophen ] Itching    MED:  Medications Prior to Admission  Medication Sig Dispense Refill Last Dose/Taking   clopidogrel  (PLAVIX ) 75 MG tablet Take 75 mg by mouth at bedtime.   11/19/2023 Morning   montelukast  (SINGULAIR ) 10 MG tablet Take 10 mg by mouth daily.   11/19/2023 Morning   simvastatin  (ZOCOR ) 20 MG tablet Take 20 mg by mouth at bedtime.   11/18/2023    All home medications have been reviewed as documented in the medication reconciliation portion of the patient record.  FH:  Family History  Problem Relation Age of Onset   Heart disease Mother    Cancer Mother        lymphoma   Cancer Father        prostate    Social:  reports that he quit smoking about 29 years ago. His smoking use included cigarettes. He started smoking about 49 years ago. He has a 20 pack-year smoking history. He has never used smokeless tobacco. He reports that he does not drink  alcohol and does not use drugs.  Review of Systems: General: Denies fever, chills, weight loss Eyes: Denies blurry vision, changes in vision ENT: Denies sore throat, congestions, nosebleeds CV: Denies chest pain, palpitations Respiratory: Denies shortness of breath, wheezing, cough Gl: Denies abdominal pain, nausea, vomiting GU: Denies hematuria Integumentary: Denies rashes or lesions Neuro: Denies headache, dizziness Psych: Negative Hem/Onc: Denies easy bruising or bleeding disorders Musculoskeletal: See HPI above.  Vitals: BP (!) 108/55 (BP Location: Left Arm)   Pulse 82    Temp (!) 97.4 F (36.3 C) (Oral)   Resp 18   Ht 5' 7 (1.702 m)   Wt 60.9 kg   SpO2 96%   BMI 21.03 kg/m    Physical Exam: General: Awake, alert and oriented, no acute distress. Eyes: Pupils reactive, EOMI, normal conjunctiva, no scleral icterus. HENT: Normocephalic, atraumatic, normal hearing, moist oral mucosa Neck: Supple, non-tender, no cervical lymphadenopathy. Lungs: Chest rise is symmetric, non-labored respiration, chest wall nontender to palpation Heart: Normal rate by palpation, normal peripheral perfusion Abdomen: Soft, non-tender, non-distended. Pelvis is stable. Skin: Skin envelope intact, dry and pink, no rashes or lesions, no signs of infection. Neurologic: Awake, alert, and oriented X3 Psychiatric: Cooperative, appropriate mood and affect.  Musculoskeletal: Evaluation the patient's symptomatic right knee reveals a moderate effusion.  Range of motion is greatly decreased.  A scar is present over the anterior aspect of the knee from previous tibial nail insertion surgery.  Calf is soft and nontender.  Dorsalis pedis and posterior pulse are 2+ and symmetric.  Radiographic findings: Radiographs and CT evaluation of the patient's right knee were independently reviewed on the PACS system.  The patient is status post placement of a right tibial IM nail.  No implant complications are noted.  The patient has end-stage osteoarthritic changes with bone-on-bone collapse in the medial compartment and moderate degenerative changes in the patellofemoral joint.  On further review of the CT I believe that there is a small depressed lateral plateau fracture accounting for the lipohemarthrosis.   Labs:  Recent Labs    11/20/23 1535 11/21/23 0624 11/22/23 0525  HGB 14.5 13.1 11.6*   Recent Labs    11/21/23 0624 11/22/23 0525  WBC 11.3* 10.1  RBC 4.12* 3.75*  HCT 38.2* 35.2*  PLT 182 175   Recent Labs    11/21/23 0624 11/22/23 0525  NA 137 138  K 4.2 4.1  CL 107 107  CO2  23 22  BUN 33* 35*  CREATININE 1.70* 1.60*  GLUCOSE 112* 106*  CALCIUM 8.6* 7.9*   No results for input(s): LABPT, INR in the last 72 hours.   Assessment/Plan:    Assessment: 89 year old male status post syncopal episode and ground-level fall with right minimally depressed small lateral tibial plateau fracture and end-stage osteoarthritic changes status post right tibia IM nail placement in the past.   Plan:  I discussed with the hospitalist that I would recommend conservative treatment for his right knee injury consisting of a knee immobilizer when working with PT out of bed.  He does not have to have the knee immobilizer own while resting quietly in bed but only as comfort allows.  The patient is currently having his syncopal episode evaluated and worked up and discharge planning.  The patient can be discharged from the orthopedic standpoint and follow-up in 10 to 14 days in the clinic.  We discussed that with his age and end-stage osteoarthritic changes nonoperative treatment to be recommended, if the patient remains symptomatic then a  right primary total knee arthroplasty could be discussed for pain control.  That would occur approximately 3 months after injury.  No acute treatment is needed for that he lipohemarthrosis.  Cordella Lamar Knack M.D. 11/22/2023 3:55 PM

## 2023-11-23 DIAGNOSIS — R55 Syncope and collapse: Secondary | ICD-10-CM | POA: Diagnosis not present

## 2023-11-23 DIAGNOSIS — Y92009 Unspecified place in unspecified non-institutional (private) residence as the place of occurrence of the external cause: Secondary | ICD-10-CM | POA: Diagnosis not present

## 2023-11-23 DIAGNOSIS — W19XXXA Unspecified fall, initial encounter: Secondary | ICD-10-CM | POA: Diagnosis not present

## 2023-11-23 LAB — CULTURE, RESPIRATORY W GRAM STAIN

## 2023-11-23 LAB — CBC WITH DIFFERENTIAL/PLATELET
Abs Immature Granulocytes: 0.06 10*3/uL (ref 0.00–0.07)
Basophils Absolute: 0 10*3/uL (ref 0.0–0.1)
Basophils Relative: 0 %
Eosinophils Absolute: 0.2 10*3/uL (ref 0.0–0.5)
Eosinophils Relative: 2 %
HCT: 34.8 % — ABNORMAL LOW (ref 39.0–52.0)
Hemoglobin: 11.5 g/dL — ABNORMAL LOW (ref 13.0–17.0)
Immature Granulocytes: 1 %
Lymphocytes Relative: 10 %
Lymphs Abs: 1 10*3/uL (ref 0.7–4.0)
MCH: 31.3 pg (ref 26.0–34.0)
MCHC: 33 g/dL (ref 30.0–36.0)
MCV: 94.8 fL (ref 80.0–100.0)
Monocytes Absolute: 1.3 10*3/uL — ABNORMAL HIGH (ref 0.1–1.0)
Monocytes Relative: 14 %
Neutro Abs: 6.6 10*3/uL (ref 1.7–7.7)
Neutrophils Relative %: 73 %
Platelets: 187 10*3/uL (ref 150–400)
RBC: 3.67 MIL/uL — ABNORMAL LOW (ref 4.22–5.81)
RDW: 14.2 % (ref 11.5–15.5)
WBC: 9.2 10*3/uL (ref 4.0–10.5)
nRBC: 0 % (ref 0.0–0.2)

## 2023-11-23 LAB — BASIC METABOLIC PANEL
Anion gap: 10 (ref 5–15)
BUN: 33 mg/dL — ABNORMAL HIGH (ref 8–23)
CO2: 21 mmol/L — ABNORMAL LOW (ref 22–32)
Calcium: 8 mg/dL — ABNORMAL LOW (ref 8.9–10.3)
Chloride: 106 mmol/L (ref 98–111)
Creatinine, Ser: 1.71 mg/dL — ABNORMAL HIGH (ref 0.61–1.24)
GFR, Estimated: 37 mL/min — ABNORMAL LOW (ref >60)
Glucose, Bld: 102 mg/dL — ABNORMAL HIGH (ref 70–99)
Potassium: 4.1 mmol/L (ref 3.5–5.1)
Sodium: 137 mmol/L (ref 135–145)

## 2023-11-23 LAB — MAGNESIUM: Magnesium: 1.9 mg/dL (ref 1.7–2.4)

## 2023-11-23 NOTE — Progress Notes (Signed)
 Progress Note   Patient: Alexander Webster FMW:[id number] DOB: [birth year] DOA: 11/20/2023     2 DOS: the patient was seen and examined on 11/23/2023   Brief hospital course: 89yo with h/o pacemaker ([employer] Micra AV leadless (07/07/21), HLD, CAD, dCHF, CKD-3b, and aspiration pneumonia who presented on 1/4 with a fall and syncope.  Of note, pt had dizziness and was sent to ED by cardiologist 11/12/2023.  CTA of head was recommended, however, pt did not wish to stay for further workup and left AMA. Since his recent fall he has had ongoing right knee pain and swelling as well as left-sided chest wall pains.  Multiple imaging studies performed and he was found to have mildly displaced fractures of the left seventh and eighth ribs.  Assessment and Plan:  Fall at home Patient has been endorsing dizziness when standing and first ambulating recently, raising concern for orthostasis Orthostatic blood pressure performed 11/21/2023 and grossly positive (110/60 >> 95/56 >> 61/51) Started on IV fluids (now off) Repeat orthostatics 1/7 AM are still positive, although they recover at 3 minutes; for now, would recommend behavior modification, standing still after standing until BP normalizes Consider addition of midodrine Fall resulting in R knee injury, L rib fractures Patient reports this was a mechanical fall (tripped over his wife, who also fell) He has been recommended for SNF; he initially declined but now agrees that SNF rehab is needed Recheck orthostatics in AM   Right knee pain Notable swelling and probable effusion on exam Right knee x-ray on admission negative for fracture or dislocation.  Showed moderate tricompartmental arthrosis CT performed without obvious fracture but with lipohemarthrosis  Tibial plateau fracture, per Dr. Maryrose - subtle and nonoperative, needs knee immobilizer with ambulation WBAT Will need outpatient f/u in [facility] - if not improving, knee replacement may need to be  considered    Left rib fracture Left 7th and 8th rib fractures s/p fall Scheduled Tylenol  Lidocaine  patch Robaxin  PRN Fentanyl  for severe pain Discussed CPR and he agrees that DNR is most appropriate   Aspiration, chronic pulmonary CT chest shows bronchial wall thickening and plugging throughout bilateral bases with scarring or atelectasis of left lung base concerning for chronic aspiration Continue Augmentin  for now SLP evaluated and provided recommendations   CAD (coronary artery disease) Continue Plavix , Zocor  Mild troponin leak on presentation, low suspicion for ACS   Chronic diastolic CHF (congestive heart failure)  No signs or symptoms of exacerbation nor any volume overload Last echo August 2022.  EF 55 to 60%, normal diastolic parameters, no RWMA. Repeat echo with preserved EF, indeterminate diastolic function, no significant findings   Stage 3b chronic kidney disease  Appears to be stable at this time Attempt to avoid nephrotoxic medications Recheck BMP in AM    HLD (hyperlipidemia) Continue Zocor    Chronic obstructive pulmonary disease (HCC) No wheezing or signs of exacerbation at this time Continue PRN breathing treatments and daily Singulair        Consultants: Orthopedics (telephone only) PT OT SLP TOC team   Procedures: None   Antibiotics: Augmentin  1/4-8    30 Day Unplanned Readmission Risk Score    Flowsheet Row ED to Hosp-Admission (Current) from 11/20/2023 in [facility]  30 Day Unplanned Readmission Risk Score (%) 16.26 Filed at 11/23/2023 0802       This score is the patient's risk of an unplanned readmission within 30 days of being discharged (0 -100%). The score is based on dignosis, age,  lab data, medications, orders, and past utilization.   Low:  0-14.9   Medium: 15-21.9   High: 22-29.9   Extreme: 30 and above           Subjective: Feeling ok, no specific concerns. Ribs are feeling better,  knee effusion might be a touch smaller.   Objective: Vitals:   11/22/23 1949 11/23/23 0410  BP: (!) 108/59 119/72  Pulse: 94 88  Resp: 20 20  Temp: 98 F (36.7 C) 97.9 F (36.6 C)  SpO2: 95% 96%    Intake/Output Summary (Last 24 hours) at 11/23/2023 0813 Last data filed at 11/22/2023 2055 Gross per 24 hour  Intake --  Output 425 ml  Net -425 ml   Filed Weights   11/21/23 0422 11/22/23 0432 11/23/23 0433  Weight: 70.9 kg 60.9 kg 70.5 kg    Exam:  General:  Appears calm and comfortable and is in NAD Eyes:  EOMI, normal lids, iris ENT:  grossly normal hearing, lips & tongue, mmm Neck:  no LAD, masses or thyromegaly Cardiovascular:  RRR, no m/r/g. No LE edema.  Respiratory:   CTA bilaterally with no wheezes/rales/rhonchi.  Normal respiratory effort.  Ribs examined, no frank deformity or anterior bruising. Abdomen:  soft, NT, ND Skin:  no rash or induration seen on limited exam Musculoskeletal:  R knee with significant but slightly smaller effusion, no warmth Psychiatric:  pleasant mood and affect, speech fluent and appropriate, AOx3 Neurologic:  CN 2-12 grossly intact, moves all extremities in coordinated fashion  Data Reviewed: I have reviewed the patient's lab results since admission.  Pertinent labs for today include:   BUN 33/Creatinine 1.71/GFR 37 - stable Stable CBC     Family Communication: Nephew was present throughout evaluation  Disposition: Status is: Inpatient Remains inpatient appropriate because: awaiting placement     Time spent: 35 minutes  Unresulted Labs (From admission, onward)     Start     Ordered   11/22/23 0500  Basic metabolic panel  Daily,   R     Question:  Specimen collection method  Answer:  Lab=Lab collect   11/21/23 1126   11/22/23 0500  CBC with Differential/Platelet  Daily,   R     Question:  Specimen collection method  Answer:  Lab=Lab collect   11/21/23 1126   11/22/23 0500  Magnesium  Daily,   R     Question:  Specimen  collection method  Answer:  Lab=Lab collect   11/21/23 1126             Author: Delon Herald, MD 11/23/2023 8:13 AM  For on call review [email or web address].

## 2023-11-23 NOTE — Progress Notes (Signed)
 Occupational Therapy Treatment Patient Details Name: Alexander Webster MRN: [id number] DOB: 04/14/1928 Today's Date: 11/23/2023   History of present illness Patient is a 89 year old male who fell 2 days ago when walking due to feeling dizzy. Family reports patient passed out, no head injury, c/o of L chest wall and R knee pain. Patient was sent to ED on 11/12/23 but left AMA.  X ray: R knee and foot negative for fracture: ribs: L 7th and 8th rib fx. Pmh includes pacemaker, HLD, CAD, dCHF, CKD 3b, aspiration pneumonia, and syncope.   OT comments  Pt is supine in bed on arrival. Pleasant and agreeable to OT session. He does not complain of pain during session. Knee immobilizer donned in supine position. Orthostatics monitored during session and are as follows:  Orthostatic VS   BP- Lying Pulse- Lying BP- Sitting Pulse- Sitting BP- Standing at 0 minutes Pulse- Standing at 0 minutes BP-Standing at 3 minutes Pulse- Standing at 3 minutes  11/23/23 1540 103/65 97 96/53 67 (!) 76/40 91 90/59 66    Pt needed SUP for bed mobility and scooting forward and laterally in bed. He required CGA x2 for safety to stand from EOB to RW and perform SPT to recliner. He was able to take forward/backward steps using RW with CGA x2 for safety. Pt noted with dizziness throughout session after all movements. Last BP taken seated in recliner was 126/59 with pt still reporting dizziness. He was left with all needs in place and will cont to require skilled acute OT services to maximize his safety and IND to return to PLOF.       If plan is discharge home, recommend the following:  Assistance with cooking/housework;Assist for transportation;A little help with walking and/or transfers;A lot of help with bathing/dressing/bathroom   Equipment Recommendations  Other (comment) (defer to next venue)    Recommendations for Other Services      Precautions / Restrictions Precautions Precautions: Fall Required Braces or Orthoses: Knee  Immobilizer - Right Knee Immobilizer - Right: On when out of bed or walking Restrictions Weight Bearing Restrictions Per Provider Order: Yes RLE Weight Bearing Per Provider Order: Weight bearing as tolerated Other Position/Activity Restrictions: watch orthostatics       Mobility Bed Mobility Overal bed mobility: Needs Assistance Bed Mobility: Supine to Sit     Supine to sit: Supervision, HOB elevated, Used rails     General bed mobility comments: SUP for supine to sit at EOB    Transfers Overall transfer level: Needs assistance   Transfers: Sit to/from Stand, Bed to chair/wheelchair/BSC Sit to Stand: Contact guard assist, +2 safety/equipment     Step pivot transfers: Contact guard assist, +2 safety/equipment     General transfer comment: CGA +2 for safety d/t orthostatics and to transfer to recliner and perform forward/backward steps in front of recliner     Balance Overall balance assessment: Needs assistance Sitting-balance support: Feet supported, No upper extremity supported Sitting balance-Leahy Scale: Good     Standing balance support: Bilateral upper extremity supported, Reliant on assistive device for balance Standing balance-Leahy Scale: Fair Standing balance comment: no LOB while standing or transferring with RW                           ADL either performed or assessed with clinical judgement   ADL Overall ADL's : Needs assistance/impaired  Lower Body Dressing: Maximal assistance;Bed level   Toilet Transfer: Rolling walker (2 wheels);Contact guard assist;+2 for safety/equipment Toilet Transfer Details (indicate cue type and reason): simluated bed to recliner with CGA x2 for safety                Extremity/Trunk Assessment              Vision       Perception     Praxis      Cognition Arousal: Alert Behavior During Therapy: WFL for tasks assessed/performed Overall Cognitive Status: Within  Functional Limits for tasks assessed                                 General Comments: A and O x 4        Exercises Other Exercises Other Exercises: Edu on role of OT in acute setting and importance of performance in therapy to maximize strength, safety and IND.    Shoulder Instructions       General Comments orthostatic vitals taken and entered above with pt dizzy throughout    Pertinent Vitals/ Pain       Pain Assessment Pain Assessment: No/denies pain Pain Intervention(s): Monitored during session, Limited activity within patient's tolerance  Home Living                                          Prior Functioning/Environment              Frequency  Min 1X/week        Progress Toward Goals  OT Goals(current goals can now be found in the care plan section)  Progress towards OT goals: Progressing toward goals  Acute Rehab OT Goals OT Goal Formulation: With patient Time For Goal Achievement: 12/05/23 Potential to Achieve Goals: Fair  Plan      Co-evaluation                 AM-PAC OT 6 Clicks Daily Activity     Outcome Measure   Help from another person eating meals?: None Help from another person taking care of personal grooming?: None Help from another person toileting, which includes using toliet, bedpan, or urinal?: A Lot Help from another person bathing (including washing, rinsing, drying)?: A Lot Help from another person to put on and taking off regular upper body clothing?: A Little Help from another person to put on and taking off regular lower body clothing?: A Lot 6 Click Score: 17    End of Session Equipment Utilized During Treatment: Rolling walker (2 wheels);Gait belt  OT Visit Diagnosis: Unsteadiness on feet (R26.81);Other abnormalities of gait and mobility (R26.89);Muscle weakness (generalized) (M62.81)   Activity Tolerance Patient tolerated treatment well;Treatment limited secondary to medical  complications (Comment) (dizziness)   Patient Left with family/visitor present;with chair alarm set;with call bell/phone within reach;in chair   Nurse Communication Mobility status        Time: 1332-1401 OT Time Calculation (min): 29 min  Charges: OT General Charges $OT Visit: 1 Visit OT Treatments $Therapeutic Activity: 8-22 mins  Johnay Mano, OTR/L  11/23/23, 3:55 PM   Shyla Gayheart E Lillymae Duet 11/23/2023, 3:44 PM

## 2023-11-23 NOTE — Consult Note (Signed)
 ORTHOPAEDIC CONSULTATION  REQUESTING PHYSICIAN: Barbarann Nest, MD  Chief Complaint:   Right knee pain and swelling  History of Present Illness: Alexander Webster is a 89 y.o. male with a history of syncope and coronary artery disease who has had a pacemaker placed in the past who lives independently with his wife.  The patient was in his usual state of health when he apparently passed out and fell 3 days ago.  He was brought to the emergency room complaining of right knee pain and was subsequently admitted for workup of his syncopal episode.  Initial plain radiographs of his right knee were unremarkable for any pathology.  Subsequently, a CT scan of the right knee was obtained which was officially read as showing no evidence for an obvious fracture in either the distal femur or proximal tibia.  Orthopedic consultation was obtained from Dr. Maryrose who felt that there may be a nondisplaced fracture of the lateral tibial plateau attributing to his lipohemarthrosis.  However, because of his age, the moderate to severe underlying degenerative joint disease of the right knee, and the fact that if there is a fracture it is completely nondisplaced, he recommended nonsurgical treatment to include progressive ambulation with a walker weightbearing as tolerated and using a knee immobilizer for support.  The patient notes that his knee already is feeling better and that he got around pretty well with physical therapy today, although he did get lightheaded again which cut his therapy session short.  Past Medical History:  Diagnosis Date   Dysrhythmia    possible cause of syncope-    Leg fracture    Syncope    Past Surgical History:  Procedure Laterality Date   HERNIA REPAIR Bilateral 05-24-12   Dr Dellie   LEG SURGERY Right 02/08/2015   LOOP RECORDER INSERTION N/A 10/30/2020   Procedure: LOOP RECORDER INSERTION;  Surgeon: Ammon Blunt, MD;   Location: [facility] INVASIVE CV LAB;  Service: Cardiovascular;  Laterality: N/A;   MUSCLE REPAIR Right    calf   PACEMAKER LEADLESS INSERTION N/A 07/07/2021   Procedure: PACEMAKER LEADLESS INSERTION;  Surgeon: Ammon Blunt, MD;  Location: [facility] INVASIVE CV LAB;  Service: Cardiovascular;  Laterality: N/A;   ruptured disc     ruptured disc repair   TEMPORARY PACEMAKER N/A 07/06/2021   Procedure: TEMPORARY PACEMAKER;  Surgeon: Ammon Blunt, MD;  Location: [facility] INVASIVE CV LAB;  Service: Cardiovascular;  Laterality: N/A;   Social History   Socioeconomic History   Marital status: Married    Spouse name: Not on file   Number of children: Not on file   Years of education: Not on file   Highest education level: Not on file  Occupational History   Not on file  Tobacco Use   Smoking status: Former    Current packs/day: 0.00    Average packs/day: 1 pack/day for 20.0 years (20.0 ttl pk-yrs)    Types: Cigarettes    Start date: 08/28/1974    Quit date: 08/28/1994    Years since quitting: 29.2   Smokeless tobacco: Never   Tobacco comments:    Smoked cigarettes, pipe.   Vaping Use   Vaping status: Never Used  Substance and Sexual Activity   Alcohol use: No    Alcohol/week: 0.0 standard drinks of alcohol   Drug use: No   Sexual activity: Not Currently  Other Topics Concern   Not on file  Social History Narrative   Not on file   Social Drivers of Health  Financial Resource Strain: Low Risk  (09/22/2023)   Received from [facility]   Overall Financial Resource Strain (CARDIA)    Difficulty of Paying Living Expenses: Not hard at all  Food Insecurity: No Food Insecurity (11/20/2023)   Hunger Vital Sign    Worried About Running Out of Food in the Last Year: Never true    Ran Out of Food in the Last Year: Never true  Transportation Needs: No Transportation Needs (11/20/2023)   PRAPARE - Administrator, Civil Service (Medical): No    Lack of  Transportation (Non-Medical): No  Physical Activity: Not on file  Stress: Not on file  Social Connections: Socially Integrated (11/20/2023)   Social Connection and Isolation Panel [NHANES]    Frequency of Communication with Friends and Family: More than three times a week    Frequency of Social Gatherings with Friends and Family: More than three times a week    Attends Religious Services: 1 to 4 times per year    Active Member of [employer] or Organizations: No    Attends Engineer, Structural: 1 to 4 times per year    Marital Status: Married   Family History  Problem Relation Age of Onset   Heart disease Mother    Cancer Mother        lymphoma   Cancer Father        prostate   Allergies  Allergen Reactions   Percocet [Oxycodone-Acetaminophen ] Itching    Itching   Vicodin [Hydrocodone-Acetaminophen ] Itching   Prior to Admission medications   Medication Sig Start Date End Date Taking? Authorizing Provider  clopidogrel  (PLAVIX ) 75 MG tablet Take 75 mg by mouth at bedtime. 08/15/20  Yes [provider]  montelukast  (SINGULAIR ) 10 MG tablet Take 10 mg by mouth daily. 08/15/20  Yes [provider]  simvastatin  (ZOCOR ) 20 MG tablet Take 20 mg by mouth at bedtime. 09/14/20  Yes [provider]   ECHOCARDIOGRAM COMPLETE Result Date: 11/22/2023    ECHOCARDIOGRAM REPORT   Patient Name:   Alexander Webster Date of Exam: 11/22/2023 Medical Rec #:  [id number]    Height:       67.0 in Accession #:    [id number]   Weight:       134.3 lb Date of Birth:  1928-01-27    BSA:          1.707 m Patient Age:    89 years     BP:           117/62 mmHg Patient Gender: M            HR:           79 bpm. Exam Location:  [facility] Procedure: 2D Echo, Cardiac Doppler and Color Doppler Indications:     Syncope R55  History:         Patient has prior history of Echocardiogram examinations, most                  recent 07/08/2021. Signs/Symptoms:Syncope.  Sonographer:     Christopher Furnace Referring Phys:  [id number]  XILIN NIU Diagnosing Phys: Marsa Dooms MD  Sonographer Comments: Technically challenging study due to limited acoustic windows, no parasternal window and no apical window. Pt stated that he had fallen--so kept supine for echo. IMPRESSIONS  1. Left ventricular ejection fraction, by estimation, is 60 to 65%. The left ventricle has normal function. The left ventricle has no regional wall motion abnormalities. Left  ventricular diastolic parameters are indeterminate.  2. Right ventricular systolic function is normal. The right ventricular size is normal.  3. The mitral valve is normal in structure. Mild mitral valve regurgitation. No evidence of mitral stenosis.  4. The aortic valve is normal in structure. Aortic valve regurgitation is not visualized. No aortic stenosis is present.  5. The inferior vena cava is normal in size with greater than 50% respiratory variability, suggesting right atrial pressure of 3 mmHg. FINDINGS  Left Ventricle: Left ventricular ejection fraction, by estimation, is 60 to 65%. The left ventricle has normal function. The left ventricle has no regional wall motion abnormalities. The left ventricular internal cavity size was normal in size. There is  no left ventricular hypertrophy. Left ventricular diastolic parameters are indeterminate. Right Ventricle: The right ventricular size is normal. No increase in right ventricular wall thickness. Right ventricular systolic function is normal. Left Atrium: Left atrial size was normal in size. Right Atrium: Right atrial size was normal in size. Pericardium: There is no evidence of pericardial effusion. Mitral Valve: The mitral valve is normal in structure. Mild mitral valve regurgitation. No evidence of mitral valve stenosis. Tricuspid Valve: The tricuspid valve is normal in structure. Tricuspid valve regurgitation is mild . No evidence of tricuspid stenosis. Aortic Valve: The aortic valve is normal in structure. Aortic valve regurgitation is not  visualized. No aortic stenosis is present. Pulmonic Valve: The pulmonic valve was normal in structure. Pulmonic valve regurgitation is not visualized. No evidence of pulmonic stenosis. Aorta: The aortic root is normal in size and structure. Venous: The inferior vena cava is normal in size with greater than 50% respiratory variability, suggesting right atrial pressure of 3 mmHg. IAS/Shunts: No atrial level shunt detected by color flow Doppler.  LEFT VENTRICLE PLAX 2D LVIDd:         3.80 cm LVIDs:         2.60 cm LV PW:         1.30 cm LV IVS:        1.60 cm  LEFT ATRIUM         Index LA diam:    5.30 cm 3.10 cm/m  PULMONIC VALVE PR End Diast Vel: 7.40 msec  Marsa Dooms MD Electronically signed by Marsa Dooms MD Signature Date/Time: 11/22/2023/1:28:44 PM    Final    Positive ROS: All other systems have been reviewed and were otherwise negative with the exception of those mentioned in the HPI and as above.  Physical Exam: General:  Alert, no acute distress Psychiatric:  Patient is competent for consent with normal mood and affect   Cardiovascular:  No pedal edema Respiratory:  No wheezing, non-labored breathing GI:  Abdomen is soft and non-tender Skin:  No lesions in the area of chief complaint Neurologic:  Sensation intact distally Lymphatic:  No axillary or cervical lymphadenopathy  Orthopedic Exam:  Orthopedic examination is limited to the right knee and lower extremity.  Skin inspection is notable for well-healed anterior surgical incision site, but otherwise is unremarkable.  There is perhaps mild swelling as well as a moderate effusion, but no erythema, ecchymosis, abrasions, or other skin abnormalities are identified.  He is able to range his knee from 10 to near 90 degrees with only minimal discomfort.  He is grossly neurovascularly intact to the right lower extremity and foot.  X-rays:  Recent plain x-rays and a CT scan of the right knee are available for review and have been  reviewed by myself.  The  findings are as described above.  Assessment: Possible nondisplaced tibial plateau fracture with moderate underlying degenerative joint disease, right knee.  Plan: The treatment options are reviewed with the patient and his friend who is at the bedside.  I agree with the proposed plan for nonsurgical treatment of this injury, given his age, underlying degenerative joint disease, and the fact that the fracture, if present, is completely nondisplaced.  He may continue to be mobilized with physical therapy, using the knee immobilizer and walker as necessary for balance and support.  He may be able to return home within the next 1 to 2 days so long as his episodes of orthostasis/lightheadedness can be resolved.  Thank you for asking me to participate in the care of this most pleasant gentleman.  I will be happy to follow him with you.   DOROTHA Reyes Maltos, MD  Beeper #:  [phone]  11/23/2023 2:14 PM

## 2023-11-23 NOTE — TOC Progression Note (Signed)
 Transition of Care [facility]) - Progression Note    Patient Details  Name: Alexander Webster MRN: [id number] Date of Birth: Oct 13, 1928  [facility] CM/SW Contact  [name], RN Phone Number: 11/23/2023, 12:25 PM  Clinical Narrative:     Patient now agreeable to SNF PASRR obtained Fl2 sent for signature Bed search initiated   Expected Discharge Plan: Home w Home Health Services Barriers to Discharge: Continued Medical Work up  Expected Discharge Plan and Services       Living arrangements for the past 2 months: Single Family Home                           HH Arranged: PT, OT, RN, Nurse's Aide HH Agency: [facility] Date [facility] Agency Contacted: 11/21/23   Representative spoke with at [facility] Agency: [place]   Social Determinants of Health (SDOH) Interventions SDOH Screenings   Food Insecurity: No Food Insecurity (11/20/2023)  Housing: Low Risk  (11/20/2023)  Transportation Needs: No Transportation Needs (11/20/2023)  Utilities: Not At Risk (11/20/2023)  Financial Resource Strain: Low Risk  (09/22/2023)   Received from [facility]  Social Connections: Socially Integrated (11/20/2023)  Tobacco Use: Medium Risk (11/20/2023)    Readmission Risk Interventions    11/21/2023    5:19 PM  Readmission Risk Prevention Plan  Post Dischage Appt Complete  Medication Screening Complete  Transportation Screening Complete

## 2023-11-23 NOTE — NC FL2 (Signed)
 [place]  MEDICAID FL2 LEVEL OF CARE FORM     IDENTIFICATION  Patient Name: Alexander Webster Birthdate: 15-Mar-1928 Sex: male Admission Date (Current Location): 11/20/2023  [facility] [place] Number:  Chiropodist and Address:         Provider Number: [id number]  Attending Physician Name and Address:  Barbarann Nest, MD  Relative Name and Phone Number:       Current Level of Care: Hospital Recommended Level of Care: Skilled Nursing Facility Prior Approval Number:    Date Approved/Denied:   PASRR Number: [id number]  Discharge Plan: SNF    Current Diagnoses: Patient Active Problem List   Diagnosis Date Noted   Leukocytosis 11/21/2023   Right knee pain 11/21/2023   Orthostatic hypotension 11/20/2023   HLD (hyperlipidemia) 11/20/2023   Chronic diastolic CHF (congestive heart failure) (HCC) 11/20/2023   Fall at home, initial encounter 11/20/2023   CAD (coronary artery disease) 11/20/2023   Myocardial injury 11/20/2023   Left rib fracture 11/20/2023   Complete heart block (HCC)    Cardiogenic shock (HCC)    Chronic obstructive pulmonary disease (HCC)    Stage 3b chronic kidney disease (HCC)    Coronary artery disease due to lipid rich plaque    Symptomatic bradycardia 07/06/2021   Gastroesophageal reflux disease    Aspiration, chronic pulmonary 03/17/2021   Acute respiratory failure (HCC) 03/17/2021   Cough 08/28/2014   Vasomotor rhinitis 08/28/2014    Orientation RESPIRATION BLADDER Height & Weight     Time, Situation, Place, Self  Normal Continent Weight: 70.5 kg Height:  5' 7 (170.2 cm)  BEHAVIORAL SYMPTOMS/MOOD NEUROLOGICAL BOWEL NUTRITION STATUS      Continent Diet (regular)  AMBULATORY STATUS COMMUNICATION OF NEEDS Skin   Extensive Assist Verbally Normal                       Personal Care Assistance Level of Assistance              Functional Limitations Info             SPECIAL CARE FACTORS FREQUENCY  PT (By licensed PT),  OT (By licensed OT)                    Contractures Contractures Info: Not present    Additional Factors Info  Code Status, Allergies Code Status Info: DNR Allergies Info: Percocet (Oxycodone-acetaminophen ), Vicodin (Hydrocodone-acetaminophen )           Current Medications (11/23/2023):  This is the current hospital active medication list Current Facility-Administered Medications  Medication Dose Route Frequency Provider Last Rate Last Admin   acetaminophen  (TYLENOL ) tablet 1,000 mg  1,000 mg Oral QID Patsy Lenis, MD   1,000 mg at 11/23/23 1000   albuterol  (PROVENTIL ) (2.5 MG/3ML) 0.083% nebulizer solution 2.5 mg  2.5 mg Inhalation Q4H PRN Niu, Xilin, MD       amoxicillin -clavulanate (AUGMENTIN ) 500-125 MG per tablet 1 tablet  1 tablet Oral BID Barbarann Nest, MD   1 tablet at 11/23/23 9043   clopidogrel  (PLAVIX ) tablet 75 mg  75 mg Oral QHS Niu, Xilin, MD   75 mg at 11/22/23 2127   dextromethorphan-guaiFENesin  (MUCINEX  DM) 30-600 MG per 12 hr tablet 1 tablet  1 tablet Oral BID PRN Niu, Xilin, MD       enoxaparin  (LOVENOX ) injection 30 mg  30 mg Subcutaneous Q24H Niu, Xilin, MD   30 mg at 11/22/23 2127   feeding supplement (ENSURE ENLIVE /  ENSURE PLUS) liquid 237 mL  237 mL Oral BID BM Niu, Xilin, MD   237 mL at 11/23/23 1002   fentaNYL  (SUBLIMAZE ) injection 12.5 mcg  12.5 mcg Intravenous Q3H PRN Niu, Xilin, MD       lidocaine  (LIDODERM ) 5 % 1 patch  1 patch Transdermal Q24H Niu, Xilin, MD   1 patch at 11/22/23 1750   methocarbamol  (ROBAXIN ) tablet 500 mg  500 mg Oral Q8H PRN Niu, Xilin, MD   500 mg at 11/20/23 2215   montelukast  (SINGULAIR ) tablet 10 mg  10 mg Oral Daily Niu, Xilin, MD   10 mg at 11/23/23 9043   ondansetron  (ZOFRAN ) injection 4 mg  4 mg Intravenous Q8H PRN Niu, Xilin, MD       Oral care mouth rinse  15 mL Mouth Rinse 4 times per day Niu, Xilin, MD   15 mL at 11/23/23 1116   Oral care mouth rinse  15 mL Mouth Rinse PRN Patsy Lenis, MD       pantoprazole   (PROTONIX ) EC tablet 40 mg  40 mg Oral Daily Patsy Lenis, MD   40 mg at 11/23/23 1002   simvastatin  (ZOCOR ) tablet 20 mg  20 mg Oral QHS Niu, Xilin, MD   20 mg at 11/22/23 2127     Discharge Medications: Please see discharge summary for a list of discharge medications.  Relevant Imaging Results:  Relevant Lab Results:   Additional Information ss [id number]  [name], RN

## 2023-11-23 NOTE — Progress Notes (Signed)
 Physical Therapy Treatment Patient Details Name: Alexander Webster MRN: [id number] DOB: January 29, 1928 Today's Date: 11/23/2023   History of Present Illness Patient is a 89 year old male who fell 2 days ago when walking due to feeling dizzy. Family reports patient passed out, no head injury, c/o of L chest wall and R knee pain. Patient was sent to ED on 11/12/23 but left AMA.  X ray: R knee and foot negative for fracture initially but now diagnosed with possible lateral right tibial plateau fracture as well as L 7th and 8th rib fx. PMH includes pacemaker, HLD, CAD, dCHF, CKD 3b, aspiration pneumonia, and syncope.    PT Comments  Pt was pleasant and motivated to participate during the session and put forth good effort throughout.  Pt required no physical assistance during the session but was provided close +2 CGA secondary to symptomatic orthostatic BPs.  Co-treat provided with OT this date, please refer to OT note this date for results of orthostatic BPs.  In general pt presented with only a small drop in systolic BP going from sup to sit but was mildly symptomatic.  Pt then presented with a 20 point systolic drop going from sitting to standing but only mildly symptomatic.  Pt able to then amb forwards/backwards staying close to the chair for safety around a total of 8 feet and then remain in standing for a total standing time around 2-3 min then took a second standing BP that was close to his baseline level.  Despite his BP coming up after being in standing several minutes his symptoms began to worsen and he was returned to sitting with seated BP taken which was WNL.  Pt stood/ambulated with his KI donned to his RLE and was generally steady when upright with no significant pain noted.  Pt primarily limited by orthostatics at this point which leads him to being a very high risk for falls.  Pt will benefit from continued PT services upon discharge to safely address deficits listed in patient problem list for decreased  caregiver assistance and eventual return to PLOF.      If plan is discharge home, recommend the following: A lot of help with walking and/or transfers;A lot of help with bathing/dressing/bathroom;Assistance with cooking/housework;Assist for transportation;Help with stairs or ramp for entrance   Can travel by private vehicle     Yes  Equipment Recommendations  Other (comment) (TBD at next venue of care)    Recommendations for Other Services       Precautions / Restrictions Precautions Precautions: Fall Required Braces or Orthoses: Knee Immobilizer - Right Knee Immobilizer - Right: On when out of bed or walking Restrictions Weight Bearing Restrictions Per Provider Order: Yes RLE Weight Bearing Per Provider Order: Weight bearing as tolerated Other Position/Activity Restrictions: watch orthostatics     Mobility  Bed Mobility Overal bed mobility: Modified Independent             General bed mobility comments: Min extra time, effort, and use of the bed rail only, no physical assist needed this session    Transfers Overall transfer level: Needs assistance Equipment used: Rolling walker (2 wheels) Transfers: Sit to/from Stand Sit to Stand: Contact guard assist, +2 safety/equipment           General transfer comment: +2 CGA for safety d/t orthostatics    Ambulation/Gait Ambulation/Gait assistance: +2 safety/equipment, Contact guard assist Gait Distance (Feet): 8 Feet Assistive device: Rolling walker (2 wheels) Gait Pattern/deviations: Step-through pattern, Decreased step length -  right, Decreased step length - left Gait velocity: decreased     General Gait Details: Pt able to amb forwards/backwards near the chair with +2 CGA for safety secondary to (+) orthostatics   Stairs             Wheelchair Mobility     Tilt Bed    Modified Rankin (Stroke Patients Only)       Balance Overall balance assessment: Needs assistance Sitting-balance support:  Bilateral upper extremity supported Sitting balance-Leahy Scale: Good     Standing balance support: Bilateral upper extremity supported, Reliant on assistive device for balance, During functional activity Standing balance-Leahy Scale: Fair                              Cognition Arousal: Alert Behavior During Therapy: WFL for tasks assessed/performed Overall Cognitive Status: Within Functional Limits for tasks assessed                                          Exercises      General Comments General comments (skin integrity, edema, etc.): orthostatic vitals taken and entered above with pt dizzy throughout      Pertinent Vitals/Pain Pain Assessment Pain Assessment: No/denies pain    Home Living                          Prior Function            PT Goals (current goals can now be found in the care plan section) Progress towards PT goals: Progressing toward goals    Frequency    Min 1X/week      PT Plan      Co-evaluation PT/OT/SLP Co-Evaluation/Treatment: Yes Reason for Co-Treatment: Complexity of the patient's impairments (multi-system involvement);For patient/therapist safety PT goals addressed during session: Mobility/safety with mobility;Proper use of DME;Strengthening/ROM;Balance        AM-PAC PT 6 Clicks Mobility   Outcome Measure  Help needed turning from your back to your side while in a flat bed without using bedrails?: None Help needed moving from lying on your back to sitting on the side of a flat bed without using bedrails?: None Help needed moving to and from a bed to a chair (including a wheelchair)?: A Little Help needed standing up from a chair using your arms (e.g., wheelchair or bedside chair)?: A Little Help needed to walk in hospital room?: A Lot Help needed climbing 3-5 steps with a railing? : A Lot 6 Click Score: 18    End of Session Equipment Utilized During Treatment: Gait belt;Right knee  immobilizer Activity Tolerance: Other (comment) (limited by orthostatics) Patient left: in chair;with call bell/phone within reach;with chair alarm set;with family/visitor present (OK up to chair per nursing) Nurse Communication: Mobility status;Other (comment) (Orthostatic BPs per above) PT Visit Diagnosis: Unsteadiness on feet (R26.81);Other abnormalities of gait and mobility (R26.89);Muscle weakness (generalized) (M62.81);History of falling (Z91.81);Difficulty in walking, not elsewhere classified (R26.2);Dizziness and giddiness (R42);Pain Pain - Right/Left: Right Pain - part of body: Knee     Time: 1331-1400 PT Time Calculation (min) (ACUTE ONLY): 29 min  Charges:    $Therapeutic Activity: 8-22 mins PT General Charges $$ ACUTE PT VISIT: 1 Visit  CHARM Glendia Bertin PT, DPT 11/23/23, 4:33 PM

## 2023-11-24 DIAGNOSIS — I951 Orthostatic hypotension: Secondary | ICD-10-CM

## 2023-11-24 LAB — BASIC METABOLIC PANEL
Anion gap: 9 (ref 5–15)
BUN: 35 mg/dL — ABNORMAL HIGH (ref 8–23)
CO2: 25 mmol/L (ref 22–32)
Calcium: 8.3 mg/dL — ABNORMAL LOW (ref 8.9–10.3)
Chloride: 107 mmol/L (ref 98–111)
Creatinine, Ser: 1.72 mg/dL — ABNORMAL HIGH (ref 0.61–1.24)
GFR, Estimated: 37 mL/min — ABNORMAL LOW (ref >60)
Glucose, Bld: 100 mg/dL — ABNORMAL HIGH (ref 70–99)
Potassium: 4.4 mmol/L (ref 3.5–5.1)
Sodium: 141 mmol/L (ref 135–145)

## 2023-11-24 LAB — CBC WITH DIFFERENTIAL/PLATELET
Abs Immature Granulocytes: 0.02 10*3/uL (ref 0.00–0.07)
Basophils Absolute: 0 10*3/uL (ref 0.0–0.1)
Basophils Relative: 1 %
Eosinophils Absolute: 0.3 10*3/uL (ref 0.0–0.5)
Eosinophils Relative: 4 %
HCT: 34.6 % — ABNORMAL LOW (ref 39.0–52.0)
Hemoglobin: 11.6 g/dL — ABNORMAL LOW (ref 13.0–17.0)
Immature Granulocytes: 0 %
Lymphocytes Relative: 14 %
Lymphs Abs: 0.9 10*3/uL (ref 0.7–4.0)
MCH: 31.5 pg (ref 26.0–34.0)
MCHC: 33.5 g/dL (ref 30.0–36.0)
MCV: 94 fL (ref 80.0–100.0)
Monocytes Absolute: 0.9 10*3/uL (ref 0.1–1.0)
Monocytes Relative: 15 %
Neutro Abs: 4 10*3/uL (ref 1.7–7.7)
Neutrophils Relative %: 66 %
Platelets: 208 10*3/uL (ref 150–400)
RBC: 3.68 MIL/uL — ABNORMAL LOW (ref 4.22–5.81)
RDW: 14.4 % (ref 11.5–15.5)
WBC: 6 10*3/uL (ref 4.0–10.5)
nRBC: 0 % (ref 0.0–0.2)

## 2023-11-24 LAB — MAGNESIUM: Magnesium: 2 mg/dL (ref 1.7–2.4)

## 2023-11-24 MED ORDER — MECLIZINE HCL 25 MG PO TABS: 12.5000 mg | ORAL_TABLET | Freq: Two times a day (BID) | ORAL | Status: DC | PRN

## 2023-11-24 MED ORDER — MECLIZINE HCL 12.5 MG PO TABS: 12.5000 mg | ORAL_TABLET | Freq: Two times a day (BID) | ORAL | 0 refills | Status: AC | PRN

## 2023-11-24 MED ORDER — LIDOCAINE 5 % EX PTCH: 1.0000 | MEDICATED_PATCH | CUTANEOUS | 0 refills | Status: AC

## 2023-11-24 NOTE — Care Management Important Message (Signed)
 Important Message  Patient Details  Name: ANKIT DEGREGORIO MRN: [id number] Date of Birth: 23-Jan-1928   Important Message Given:  Yes - Medicare IM     Sherilyn Banker 11/24/2023, 12:15 PM

## 2023-11-24 NOTE — Plan of Care (Signed)
   Problem: Education: Goal: Knowledge of General Education information will improve Description Including pain rating scale, medication(s)/side effects and non-pharmacologic comfort measures Outcome: Progressing

## 2023-11-24 NOTE — TOC Transition Note (Signed)
 Transition of Care [facility]) - Discharge Note   Patient Details  Name: Alexander Webster MRN: [id number] Date of Birth: 21-May-1928  [facility] CM/SW Contact:  [name], RN Phone Number: 11/24/2023, 4:14 PM   Clinical Narrative:     Bed offers presented to patient  Patient has decided to return home with home health services Nephew Buddy at bedside and will provide transport.  Channing with [facility] notified of discharge  Referral to Jon with [employer] for Rw to be delivered to room    Barriers to Discharge: Continued Medical Work up   Patient Goals and CMS Choice Patient states their goals for this hospitalization and ongoing recovery are:: declined SNF CMS Medicare.gov Compare Post Acute Care list provided to:: Patient Choice offered to / list presented to : Patient      Discharge Placement                       Discharge Plan and Services Additional resources added to the After Visit Summary for                            [facility] Arranged: PT, OT, RN, Nurse's Aide [facility] Agency: [employer] [facility] Date [facility] Agency Contacted: 11/21/23   Representative spoke with at [facility] Agency: Channing  Social Drivers of Health (SDOH) Interventions SDOH Screenings   Food Insecurity: No Food Insecurity (11/20/2023)  Housing: Low Risk  (11/20/2023)  Transportation Needs: No Transportation Needs (11/20/2023)  Utilities: Not At Risk (11/20/2023)  Financial Resource Strain: Low Risk  (09/22/2023)   Received from [facility]  Social Connections: Socially Integrated (11/20/2023)  Tobacco Use: Medium Risk (11/20/2023)     Readmission Risk Interventions    11/21/2023    5:19 PM  Readmission Risk Prevention Plan  Post Dischage Appt Complete  Medication Screening Complete  Transportation Screening Complete

## 2023-11-24 NOTE — Discharge Summary (Signed)
 Physician Discharge Summary  Alexander Webster FMW:[id number] DOB: 29-Dec-1927 DOA: 11/20/2023  PCP: Alexander Webster PARAS, MD  Admit date: 11/20/2023 Discharge date: 11/24/2023  Admitted From: home  Disposition:  home w/ home health   Recommendations for Outpatient Follow-up:  Follow up with PCP in 1-2 weeks F/u w/ ortho surg, Dr. Maryrose or PA Alexander Webster, in 10 days   Home Health: yes Equipment/Devices:   Discharge Condition: stable  CODE STATUS: DNR  Diet recommendation: regular   Brief/Interim Summary: HPI was taken from Dr. Hilma: Alexander Webster is a 89 y.o. male with medical history significant of complete heart block, s/p pacemaker, HLD, CAD, dCHF, CKD-3b, aspiration pneumonia, syncope, who presents with fall and syncope.   Patient reports that 2 days ago he was walking when he felt dizzy and fell. Family reported that pt passed out shortly.  No head injury. She complains of pain in left chest wall and right knee. He has dizziness and feeling of room spinning.  No unilateral numbness or weakness in extremities.  No facial droop or slurred speech. Patient has cough with white mucus production, no chest pain, palpitation, fever or chills.  Denies choking episode.  No nausea, vomiting, diarrhea or abdominal pain.  Denies symptoms of UTI. Of note, pt had dizziness and was sent to ED by cardiologist 11/12/2023.  CTA of head was recommended, however,pt did not wish to stay for further workup and left AMA.    Data reviewed independently and ED Course: pt was found to have WBC 14.4, stable renal function, troponin level 27, temperature normal, blood pressure 130/55, heart rate 88, RR 16, oxygen saturation 98% on room air. X-ray of right knee and right foot negative for bony fracture.  X-ray of ribs showed left seventh and eighth rib fracture.  CTA of head and neck negative for LVO. Pt is placed on telemetry bed for observation.   CT of the chest/pelvis/abdomen 1. No CT evidence of acute traumatic injury to the  chest, abdomen, pelvis. 2. Bronchial wall thickening and plugging throughout the bilateral lung bases with scarring or atelectasis of the left lung base. Findings suggest chronic aspiration, possibly ongoing. 3. Sigmoid diverticulosis without evidence of acute diverticulitis. 4. Coronary artery disease.   Aortic Atherosclerosis (ICD10-I70.0).   EKG: I have personally reviewed.  Sinus rhythm, QTc 481, paced rhythm, heart rate 89, regular  Discharge Diagnoses:  Principal Problem:   Fall at home, initial encounter Active Problems:   Orthostatic hypotension   Left rib fracture   Right knee pain   Aspiration, chronic pulmonary   CAD (coronary artery disease)   Chronic diastolic CHF (congestive heart failure) (HCC)   Stage 3b chronic kidney disease (HCC)   Chronic obstructive pulmonary disease (HCC)   HLD (hyperlipidemia)  Orthostatic hypotension:orthostatic vitals were positive on 11/21/23 but repeat orthostatic vitals on 11/24/23 are not. No need for midodrine currently   Tibial plateau fracture: subtle and nonoperative as per ortho surg. Continue w/ knee immbolizer and WBAT as per ortho surg. Will need f/u outpatient w/ orth surg   Left rib fracture: of 7th and 8th left ribs. Secondary to fall. Lidocaine  patchs prn. Encourage incentive spirometry    Chronic pulmonary aspiration: completed augmentin  course.    Hx of CAD: continue on plavix , statin    Chronic diastolic CHF: continue on statin. Not on any BB or ACE-I as per med rec Last echo August 2022 showed EF 55 to 60%, normal diastolic parameters, no RWMA.   CKDIIIb: Cr is labile.  Avoid nephrotoxic meds    HLD: continue on statin    COPD: w/o exacerbation. Bronchodilators prn     Discharge Instructions  Discharge Instructions     Diet general   Complete by: As directed    Discharge instructions   Complete by: As directed    F/u w/ PCP in 1-2 weeks. F/u ortho surg, Dr. Maryrose, in 1-2 weeks   Increase activity slowly    Complete by: As directed       Allergies as of 11/24/2023       Reactions   Percocet [oxycodone-acetaminophen ] Itching   Itching   Vicodin [hydrocodone-acetaminophen ] Itching        Medication List     TAKE these medications    clopidogrel  75 MG tablet Commonly known as: PLAVIX  Take 75 mg by mouth at bedtime.   lidocaine  5 % Commonly known as: LIDODERM  Place 1 patch onto the skin daily. Remove & Discard patch within 12 hours or as directed by MD   meclizine  12.5 MG tablet Commonly known as: ANTIVERT  Take 1 tablet (12.5 mg total) by mouth 2 (two) times daily as needed for up to 14 days for dizziness.   montelukast  10 MG tablet Commonly known as: SINGULAIR  Take 10 mg by mouth daily.   simvastatin  20 MG tablet Commonly known as: ZOCOR  Take 20 mg by mouth at bedtime.        Follow-up Information     Alexander Boas, PA-C Follow up in 10 day(s).   Specialty: Orthopedic Surgery Why: Right knee xray for tibial plateau fracture. Contact information: [address] [place] [address] [phone]                Allergies  Allergen Reactions   Percocet [Oxycodone-Acetaminophen ] Itching    Itching   Vicodin [Hydrocodone-Acetaminophen ] Itching    Consultations: Ortho surg    Procedures/Studies: ECHOCARDIOGRAM COMPLETE Result Date: 11/22/2023    ECHOCARDIOGRAM REPORT   Patient Name:   Alexander Webster Date of Exam: 11/22/2023 Medical Rec #:  [id number]    Height:       67.0 in Accession #:    [id number]   Weight:       134.3 lb Date of Birth:  1928-08-28    BSA:          1.707 m Patient Age:    89 years     BP:           117/62 mmHg Patient Gender: M            HR:           79 bpm. Exam Location:  [facility] Procedure: 2D Echo, Cardiac Doppler and Color Doppler Indications:     Syncope R55  History:         Patient has prior history of Echocardiogram examinations, most                  recent 07/08/2021. Signs/Symptoms:Syncope.  Sonographer:      Alexander Webster Referring Phys:  [id number] Alexander Webster Diagnosing Phys: Alexander Dooms MD  Sonographer Comments: Technically challenging study due to limited acoustic windows, no parasternal window and no apical window. Pt stated that he had fallen--so kept supine for echo. IMPRESSIONS  1. Left ventricular ejection fraction, by estimation, is 60 to 65%. The left ventricle has normal function. The left ventricle has no regional wall motion abnormalities. Left ventricular diastolic parameters are indeterminate.  2. Right ventricular systolic function is normal.  The right ventricular size is normal.  3. The mitral valve is normal in structure. Mild mitral valve regurgitation. No evidence of mitral stenosis.  4. The aortic valve is normal in structure. Aortic valve regurgitation is not visualized. No aortic stenosis is present.  5. The inferior vena cava is normal in size with greater than 50% respiratory variability, suggesting right atrial pressure of 3 mmHg. FINDINGS  Left Ventricle: Left ventricular ejection fraction, by estimation, is 60 to 65%. The left ventricle has normal function. The left ventricle has no regional wall motion abnormalities. The left ventricular internal cavity size was normal in size. There is  no left ventricular hypertrophy. Left ventricular diastolic parameters are indeterminate. Right Ventricle: The right ventricular size is normal. No increase in right ventricular wall thickness. Right ventricular systolic function is normal. Left Atrium: Left atrial size was normal in size. Right Atrium: Right atrial size was normal in size. Pericardium: There is no evidence of pericardial effusion. Mitral Valve: The mitral valve is normal in structure. Mild mitral valve regurgitation. No evidence of mitral valve stenosis. Tricuspid Valve: The tricuspid valve is normal in structure. Tricuspid valve regurgitation is mild . No evidence of tricuspid stenosis. Aortic Valve: The aortic valve is normal in structure.  Aortic valve regurgitation is not visualized. No aortic stenosis is present. Pulmonic Valve: The pulmonic valve was normal in structure. Pulmonic valve regurgitation is not visualized. No evidence of pulmonic stenosis. Aorta: The aortic root is normal in size and structure. Venous: The inferior vena cava is normal in size with greater than 50% respiratory variability, suggesting right atrial pressure of 3 mmHg. IAS/Shunts: No atrial level shunt detected by color flow Doppler.  LEFT VENTRICLE PLAX 2D LVIDd:         3.80 cm LVIDs:         2.60 cm LV PW:         1.30 cm LV IVS:        1.60 cm  LEFT ATRIUM         Index LA diam:    5.30 cm 3.10 cm/m  PULMONIC VALVE PR End Diast Vel: 7.40 msec  Alexander Dooms MD Electronically signed by Alexander Dooms MD Signature Date/Time: 11/22/2023/1:28:44 PM    Final    CT KNEE RIGHT WO CONTRAST Result Date: 11/21/2023 CLINICAL DATA:  Knee pain, stress fracture suspected, neg xray Knee trauma, occult fracture suspected, xray done knee swelling, worsening pain EXAM: CT OF THE RIGHT KNEE WITHOUT CONTRAST TECHNIQUE: Multidetector CT imaging of the right knee was performed according to the standard protocol. Multiplanar CT image reconstructions were also generated. RADIATION DOSE REDUCTION: This exam was performed according to the departmental dose-optimization program which includes automated exposure control, adjustment of the mA and/or kV according to patient size and/or use of iterative reconstruction technique. COMPARISON:  Radiographs 11/20/2023 and 02/08/2015 FINDINGS: Bones/Joint/Cartilage Status post right tibial intramedullary nail fixation. The visualized hardware is intact without loosening. The bones are diffusely demineralized. No displaced fracture, dislocation or osteonecrosis identified. However, there is a moderate-sized joint effusion with probable lipohemarthrosis. This suggests an underlying occult osseous injury. Underlying tricompartmental degenerative  changes are present, most advanced in the medial compartment. Ligaments Suboptimally assessed by CT.  Meniscal chondrocalcinosis noted. Muscles and Tendons The extensor mechanism is intact. Prominent semimembranosus fatty atrophy. There is additional fatty muscular atrophy anteriorly in the lower leg. Soft tissues Small to moderate Baker's cyst.  Scattered vascular calcifications. IMPRESSION: 1. No displaced fracture, dislocation or osteonecrosis identified. 2. Moderate-sized  joint effusion with probable lipohemarthrosis, suggesting an underlying occult osseous injury. 3. Underlying tricompartmental degenerative changes, most advanced in the medial compartment. 4. Status post right tibial intramedullary nail fixation without evidence of hardware complication. 5. Multifocal muscular atrophy. Electronically Signed   By: Elsie Perone M.D.   On: 11/21/2023 15:25   CT CHEST ABDOMEN PELVIS W CONTRAST Result Date: 11/20/2023 CLINICAL DATA:  Trip and fall, pain EXAM: CT CHEST, ABDOMEN, AND PELVIS WITH CONTRAST TECHNIQUE: Multidetector CT imaging of the chest, abdomen and pelvis was performed following the standard protocol during bolus administration of intravenous contrast. RADIATION DOSE REDUCTION: This exam was performed according to the departmental dose-optimization program which includes automated exposure control, adjustment of the mA and/or kV according to patient size and/or use of iterative reconstruction technique. CONTRAST:  60mL OMNIPAQUE  IOHEXOL  350 MG/ML SOLN COMPARISON:  CT abdomen pelvis, 07/01/2015 FINDINGS: CT CHEST FINDINGS Cardiovascular: Aortic atherosclerosis. Normal heart size. Left and right coronary artery calcifications. No pericardial effusion. Mediastinum/Nodes: No enlarged mediastinal, hilar, or axillary lymph nodes. Thyroid gland, trachea, and esophagus demonstrate no significant findings. Lungs/Pleura: Bronchial wall thickening and plugging throughout the bilateral lung bases with  scarring or atelectasis of the left lung base. No pleural effusion or pneumothorax. Musculoskeletal: No chest wall abnormality. Left chest implantable loop recorder no acute osseous findings. CT ABDOMEN PELVIS FINDINGS Hepatobiliary: No solid liver abnormality is seen. Benign liver cysts, for which no further follow-up or characterization is required. No gallstones, gallbladder wall thickening, or biliary dilatation. Pancreas: Unremarkable. No pancreatic ductal dilatation or surrounding inflammatory changes. Spleen: Normal in size without significant abnormality. Adrenals/Urinary Tract: Adrenal glands are unremarkable. Simple, benign bilateral renal cortical cysts, for which no further follow-up or characterization is required. Kidneys are otherwise normal, without renal calculi, solid lesion, or hydronephrosis. Bladder is unremarkable. Stomach/Bowel: Stomach is within normal limits. Appendix not clearly visualized. No evidence of bowel wall thickening, distention, or inflammatory changes. Sigmoid diverticulosis. Vascular/Lymphatic: Aortic atherosclerosis. No enlarged abdominal or pelvic lymph nodes. Reproductive: No mass or other abnormality. Other: No abdominal wall hernia or abnormality. No ascites. Musculoskeletal: No acute osseous findings. IMPRESSION: 1. No CT evidence of acute traumatic injury to the chest, abdomen, or pelvis. 2. Bronchial wall thickening and plugging throughout the bilateral lung bases with scarring or atelectasis of the left lung base. Findings suggest chronic aspiration, possibly ongoing. 3. Sigmoid diverticulosis without evidence of acute diverticulitis. 4. Coronary artery disease. Aortic Atherosclerosis (ICD10-I70.0). Electronically Signed   By: Marolyn JONETTA Jaksch M.D.   On: 11/20/2023 18:15   CT ANGIO HEAD NECK W WO CM Result Date: 11/20/2023 CLINICAL DATA:  Vertigo, central.  Fall. EXAM: CT ANGIOGRAPHY HEAD AND NECK WITH AND WITHOUT CONTRAST TECHNIQUE: Multidetector CT imaging of the head  and neck was performed using the standard protocol during bolus administration of intravenous contrast. Multiplanar CT image reconstructions and MIPs were obtained to evaluate the vascular anatomy. Carotid stenosis measurements (when applicable) are obtained utilizing NASCET criteria, using the distal internal carotid diameter as the denominator. RADIATION DOSE REDUCTION: This exam was performed according to the departmental dose-optimization program which includes automated exposure control, adjustment of the mA and/or kV according to patient size and/or use of iterative reconstruction technique. CONTRAST:  60mL OMNIPAQUE  IOHEXOL  350 MG/ML SOLN COMPARISON:  Head CT 12/03/2022 FINDINGS: CT HEAD FINDINGS Brain: There is no evidence of an acute infarct, intracranial hemorrhage, mass, midline shift, or extra-axial fluid collection. Generalized cerebral atrophy is mild for age. Cerebral white matter hypodensities are nonspecific but compatible with mild  chronic small vessel ischemic disease. Vascular: No hyperdense vessel. Skull: No acute fracture or suspicious osseous lesion. Sinuses/Orbits: Paranasal sinuses and mastoid air cells are clear. Bilateral cataract extraction. Other: None. Review of the MIP images confirms the above findings CTA NECK FINDINGS Aortic arch: Incompletely imaged, including exclusion of the origin of the brachiocephalic artery. Moderate atherosclerotic calcification in the included distal portion of the arch. At most mild stenosis of the origins of the subclavian arteries due to predominantly calcified plaque. Right carotid system: Patent with a moderate amount of predominantly calcified plaque at the carotid bifurcation. No evidence of a significant stenosis or dissection. Left carotid system: Patent with a moderate amount of predominately calcified plaque at the carotid bifurcation. No evidence of a significant stenosis or dissection. Vertebral arteries: Patent with the left being dominant.  Calcified plaque at both vertebral origins without evidence of a significant stenosis. Skeleton: Advanced mid to upper cervical facet arthrosis with grade 1 anterolisthesis of C4 on C5. Advanced disc space narrowing at C5-6 and C6-7. Other neck: No evidence of cervical lymphadenopathy or mass. Upper chest: Clear lung apices. Review of the MIP images confirms the above findings CTA HEAD FINDINGS Anterior circulation: The internal carotid arteries are patent from skull base to carotid termini with mild atherosclerosis not resulting in significant stenosis. ACAs and MCAs are patent with mild atherosclerotic irregularity but no evidence of a proximal branch occlusion or flow limiting proximal stenosis. No aneurysm is identified. Posterior circulation: The intracranial vertebral arteries are patent to the basilar with mild stenoses bilaterally. Patent PICA and SCA origins are visualized bilaterally. The basilar artery is widely patent. There are large right and diminutive or absent left posterior communicating arteries. Both PCAs are patent without evidence of a significant proximal stenosis. No aneurysm is identified. Venous sinuses: As permitted by contrast timing, patent. Anatomic variants: None. Review of the MIP images confirms the above findings IMPRESSION: 1. No evidence of acute intracranial abnormality. 2. Moderate in the head and neck without a large vessel occlusion or flow limiting proximal stenosis. 3.  Aortic Atherosclerosis (ICD10-I70.0). Electronically Signed   By: Dasie Hamburg M.D.   On: 11/20/2023 18:10   DG Knee 2 Views Right Result Date: 11/20/2023 CLINICAL DATA:  Fall, pain EXAM: RIGHT KNEE - 1-2 VIEW COMPARISON:  None Available. FINDINGS: No fracture or dislocation of the right knee. Moderate tricompartmental arthrosis. Status post intramedullary nail fixation of the right tibia, incompletely imaged. Soft tissues unremarkable. IMPRESSION: 1. No fracture or dislocation of the right knee. Moderate  tricompartmental arthrosis. 2. Status post intramedullary nail fixation of the right tibia, incompletely imaged. Electronically Signed   By: Marolyn JONETTA Jaksch M.D.   On: 11/20/2023 14:00   DG Foot Complete Right Result Date: 11/20/2023 CLINICAL DATA:  Status post mechanical fall. Tripped. Complains of right foot pain. EXAM: RIGHT FOOT COMPLETE - 3+ VIEW COMPARISON:  None FINDINGS: Signs of previous ORIF of the distal fibula and tibia. No evidence for acute fracture or dislocation. No significant arthropathy. The soft tissues appear within normal limits. IMPRESSION: 1. No acute findings. 2. Signs of previous ORIF of the tibia and distal fibula. Electronically Signed   By: Waddell Calk M.D.   On: 11/20/2023 13:59   DG Ribs Unilateral W/Chest Left Result Date: 11/20/2023 CLINICAL DATA:  Left chest pain after falling. EXAM: LEFT RIBS AND CHEST - 3+ VIEW COMPARISON:  Chest radiographs 07/06/2021.  Chest CT 04/21/2007. FINDINGS: The heart size and mediastinal contours are stable with aortic atherosclerosis. The lungs  appear clear. No evidence of pleural effusion or pneumothorax. Best seen on the oblique views are acute mildly displaced fractures of the left 7th and 8th ribs anterolaterally. There are additional old left-sided rib fractures. Loop recorders are noted within the left chest wall. IMPRESSION: Acute mildly displaced fractures of the left 7th and 8th ribs anterolaterally. No evidence of pleural effusion or pneumothorax. Electronically Signed   By: Elsie Perone M.D.   On: 11/20/2023 13:59   (Echo, Carotid, EGD, Colonoscopy, ERCP)    Subjective: Pt c/o malaise    Discharge Exam: Vitals:   11/24/23 1103 11/24/23 1524  BP: (!) 107/52 (!) 109/59  Pulse: (!) 41 71  Resp:  20  Temp:  98.4 F (36.9 C)  SpO2: 95% 96%   Vitals:   11/24/23 1056 11/24/23 1058 11/24/23 1103 11/24/23 1524  BP: (!) 99/53 (!) 83/51 (!) 107/52 (!) 109/59  Pulse: 79 69 (!) 41 71  Resp:    20  Temp:    98.4 F (36.9 C)   TempSrc:    Oral  SpO2: 94% 96% 95% 96%  Weight:      Height:        General: Pt is alert, awake, not in acute distress Cardiovascular: S1/S2 +, no rubs, no gallops Respiratory: decreased breath sounds b/l otherwise clear  Abdominal: Soft, NT, ND, bowel sounds + Extremities: no edema, no cyanosis    The results of significant diagnostics from this hospitalization (including imaging, microbiology, ancillary and laboratory) are listed below for reference.     Microbiology: Recent Results (from the past 240 hours)  Expectorated Sputum Assessment w Gram Stain, Rflx to Resp Cult     Status: None   Collection Time: 11/20/23  7:38 PM   Specimen: Expectorated Sputum  Result Value Ref Range Status   Specimen Description EXPECTORATED SPUTUM  Final   Special Requests NONE  Final   Sputum evaluation   Final    THIS SPECIMEN IS ACCEPTABLE FOR SPUTUM CULTURE Performed at [facility], [address], [place], [place] [address]    Report Status 11/20/2023 FINAL  Final  Culture, Respiratory w Gram Stain     Status: None   Collection Time: 11/20/23  7:38 PM  Result Value Ref Range Status   Specimen Description   Final    EXPECTORATED SPUTUM Performed at [facility], [address], [place], [place] [address]    Special Requests   Final    NONE Reflexed from [id number] Performed at [facility], [address] [place], [place] [address]    Gram Stain   Final    FEW WBC SEEN FEW SQUAMOUS EPITHELIAL CELLS PRESENT MODERATE GRAM POSITIVE COCCI FEW GRAM NEGATIVE RODS FEW GRAM POSITIVE RODS    Culture   Final    ABUNDANT Normal respiratory flora-no Staph aureus or Pseudomonas seen Performed at [facility], [address], [place], [place] [address]    Report Status 11/23/2023 FINAL  Final     Labs: BNP (last 3 results) Recent Labs    11/20/23 1549  BNP 301.7*   Basic Metabolic Panel: Recent Labs  Lab 11/20/23 1535 11/21/23 0624  11/22/23 0525 11/23/23 0514 11/24/23 0522  NA 138 137 138 137 141  K 4.4 4.2 4.1 4.1 4.4  CL 105 107 107 106 107  CO2 21* 23 22 21* 25  GLUCOSE 97 112* 106* 102* 100*  BUN 38* 33* 35* 33* 35*  CREATININE 1.91* 1.70* 1.60* 1.71* 1.72*  CALCIUM 9.0 8.6* 7.9* 8.0* 8.3*  MG  --   --  1.9 1.9 2.0   Liver Function Tests: Recent Labs  Lab 11/20/23 1535  AST 17  ALT 12  ALKPHOS 44  BILITOT 1.6*  PROT 7.0  ALBUMIN 3.9   No results for input(s): LIPASE, AMYLASE in the last 168 hours. No results for input(s): AMMONIA in the last 168 hours. CBC: Recent Labs  Lab 11/20/23 1535 11/21/23 0624 11/22/23 0525 11/23/23 0514 11/24/23 0522  WBC 14.4* 11.3* 10.1 9.2 6.0  NEUTROABS 11.8*  --  7.5 6.6 4.0  HGB 14.5 13.1 11.6* 11.5* 11.6*  HCT 44.2 38.2* 35.2* 34.8* 34.6*  MCV 95.9 92.7 93.9 94.8 94.0  PLT 207 182 175 187 208   Cardiac Enzymes: No results for input(s): CKTOTAL, CKMB, CKMBINDEX, TROPONINI in the last 168 hours. BNP: Invalid input(s): POCBNP CBG: No results for input(s): GLUCAP in the last 168 hours. D-Dimer No results for input(s): DDIMER in the last 72 hours. Hgb A1c No results for input(s): HGBA1C in the last 72 hours. Lipid Profile No results for input(s): CHOL, HDL, LDLCALC, TRIG, CHOLHDL, LDLDIRECT in the last 72 hours. Thyroid function studies No results for input(s): TSH, T4TOTAL, T3FREE, THYROIDAB in the last 72 hours.  Invalid input(s): FREET3 Anemia work up No results for input(s): VITAMINB12, FOLATE, FERRITIN, TIBC, IRON, RETICCTPCT in the last 72 hours. Urinalysis    Component Value Date/Time   COLORURINE YELLOW (A) 11/20/2023 1934   APPEARANCEUR CLEAR (A) 11/20/2023 1934   APPEARANCEUR Clear 02/09/2015 0054   LABSPEC 1.027 11/20/2023 1934   LABSPEC 1.028 02/09/2015 0054   PHURINE 5.0 11/20/2023 1934   GLUCOSEU 50 (A) 11/20/2023 1934   GLUCOSEU Negative 02/09/2015 0054   HGBUR MODERATE  (A) 11/20/2023 1934   BILIRUBINUR NEGATIVE 11/20/2023 1934   BILIRUBINUR Negative 02/09/2015 0054   KETONESUR NEGATIVE 11/20/2023 1934   PROTEINUR 30 (A) 11/20/2023 1934   NITRITE NEGATIVE 11/20/2023 1934   LEUKOCYTESUR NEGATIVE 11/20/2023 1934   LEUKOCYTESUR Negative 02/09/2015 0054   Sepsis Labs Recent Labs  Lab 11/21/23 0624 11/22/23 0525 11/23/23 0514 11/24/23 0522  WBC 11.3* 10.1 9.2 6.0   Microbiology Recent Results (from the past 240 hours)  Expectorated Sputum Assessment w Gram Stain, Rflx to Resp Cult     Status: None   Collection Time: 11/20/23  7:38 PM   Specimen: Expectorated Sputum  Result Value Ref Range Status   Specimen Description EXPECTORATED SPUTUM  Final   Special Requests NONE  Final   Sputum evaluation   Final    THIS SPECIMEN IS ACCEPTABLE FOR SPUTUM CULTURE Performed at [facility], [address], [place], [place] [address]    Report Status 11/20/2023 FINAL  Final  Culture, Respiratory w Gram Stain     Status: None   Collection Time: 11/20/23  7:38 PM  Result Value Ref Range Status   Specimen Description   Final    EXPECTORATED SPUTUM Performed at [facility], [address] [place], [place] [address]    Special Requests   Final    NONE Reflexed from [id number] Performed at [facility], [address] [place], [place] [address]    Gram Stain   Final    FEW WBC SEEN FEW SQUAMOUS EPITHELIAL CELLS PRESENT MODERATE GRAM POSITIVE COCCI FEW GRAM NEGATIVE RODS FEW GRAM POSITIVE RODS    Culture   Final    ABUNDANT Normal respiratory flora-no Staph aureus or Pseudomonas seen Performed at [facility], [address] [place], [place] [address]    Report Status 11/23/2023 FINAL  Final     Time coordinating discharge: Over 30 minutes  SIGNED:   Anthony CHRISTELLA Pouch, MD  [facility] 11/24/2023, 3:36 PM Pager   If 7PM-7AM, please contact night-coverage [email or web address]

## 2023-11-24 NOTE — Progress Notes (Signed)
 Physical Therapy Treatment Patient Details Name: Alexander Webster MRN: [id number] DOB: July 02, 1928 Today's Date: 11/24/2023   History of Present Illness Patient is a 89 year old male who fell 2 days ago when walking due to feeling dizzy. Family reports patient passed out, no head injury, c/o of L chest wall and R knee pain. Patient was sent to ED on 11/12/23 but left AMA.  X ray: R knee and foot negative for fracture initially but now diagnosed with possible lateral right tibial plateau fracture as well as L 7th and 8th rib fx. PMH includes pacemaker, HLD, CAD, dCHF, CKD 3b, aspiration pneumonia, and syncope.    PT Comments  Pt was pleasant and motivated to participate during the session and put forth good effort throughout. Pt required no physical assistance during the session but was provided very close CGA secondary to history of orthostatic BPs.  Pt assesses with symptoms this session and was able to tolerate transfers from multiple height surfaces and two bouts of ambulation per below with no overt LOB and with only min reported symptoms of dizziness.  Pt required cuing for proper sequencing during transfer training most notably during sitting down for proper RLE positioning with KI donned.  Pt reported no pain this session in his RLE and only very minimal L rib pain with activity.  Pt will benefit from continued PT services upon discharge to safely address deficits listed in patient problem list for decreased caregiver assistance and eventual return to PLOF.      If plan is discharge home, recommend the following: A lot of help with walking and/or transfers;A lot of help with bathing/dressing/bathroom;Assistance with cooking/housework;Assist for transportation;Help with stairs or ramp for entrance   Can travel by private vehicle     Yes  Equipment Recommendations  Rolling walker (2 wheels)    Recommendations for Other Services       Precautions / Restrictions Precautions Precautions:  Fall Required Braces or Orthoses: Knee Immobilizer - Right Knee Immobilizer - Right: On when out of bed or walking Restrictions Weight Bearing Restrictions Per Provider Order: Yes RLE Weight Bearing Per Provider Order: Weight bearing as tolerated Other Position/Activity Restrictions: watch orthostatics     Mobility  Bed Mobility Overal bed mobility: Modified Independent             General bed mobility comments: Min extra time, effort, and use of the bed rail only, no physical assist needed this session    Transfers Overall transfer level: Needs assistance Equipment used: Rolling walker (2 wheels) Transfers: Sit to/from Stand Sit to Stand: Contact guard assist           General transfer comment: Min to mod verbal and tactile cues for hand and R foot positioning    Ambulation/Gait Ambulation/Gait assistance: Contact guard assist Gait Distance (Feet): 10 Feet x 1, 15 Feet x 1 Assistive device: Rolling walker (2 wheels) Gait Pattern/deviations: Step-through pattern, Decreased step length - right, Decreased step length - left Gait velocity: decreased     General Gait Details: Pt able to amb forwards/backwards near the chair for safety secondary to history of (+) orthostatics   Stairs             Wheelchair Mobility     Tilt Bed    Modified Rankin (Stroke Patients Only)       Balance Overall balance assessment: Needs assistance Sitting-balance support: Bilateral upper extremity supported Sitting balance-Leahy Scale: Good     Standing balance support: Bilateral upper extremity  supported, Reliant on assistive device for balance, During functional activity Standing balance-Leahy Scale: Fair                              Cognition Arousal: Alert Behavior During Therapy: WFL for tasks assessed/performed Overall Cognitive Status: Within Functional Limits for tasks assessed                                           Exercises Total Joint Exercises Ankle Circles/Pumps: AROM, Strengthening, Both, 10 reps Straight Leg Raises: Strengthening, Right, 10 reps Long Arc Quad: Strengthening, Left, 10 reps Knee Flexion: Strengthening, Left, 10 reps    General Comments        Pertinent Vitals/Pain Pain Assessment Pain Assessment: No/denies pain    Home Living                          Prior Function            PT Goals (current goals can now be found in the care plan section) Progress towards PT goals: Progressing toward goals    Frequency    Min 1X/week      PT Plan      Co-evaluation              AM-PAC PT 6 Clicks Mobility   Outcome Measure  Help needed turning from your back to your side while in a flat bed without using bedrails?: None Help needed moving from lying on your back to sitting on the side of a flat bed without using bedrails?: None Help needed moving to and from a bed to a chair (including a wheelchair)?: A Little Help needed standing up from a chair using your arms (e.g., wheelchair or bedside chair)?: A Little Help needed to walk in hospital room?: A Lot Help needed climbing 3-5 steps with a railing? : A Lot 6 Click Score: 18    End of Session Equipment Utilized During Treatment: Gait belt;Right knee immobilizer Activity Tolerance: Patient tolerated treatment well Patient left: in chair;with call bell/phone within reach;with chair alarm set;with family/visitor present Nurse Communication: Mobility status PT Visit Diagnosis: Unsteadiness on feet (R26.81);Other abnormalities of gait and mobility (R26.89);Muscle weakness (generalized) (M62.81);History of falling (Z91.81);Difficulty in walking, not elsewhere classified (R26.2);Dizziness and giddiness (R42);Pain Pain - Right/Left: Right Pain - part of body: Knee     Time: 1533-1600 PT Time Calculation (min) (ACUTE ONLY): 27 min  Charges:    $Gait Training: 8-22 mins $Therapeutic Exercise: 8-22  mins PT General Charges $$ ACUTE PT VISIT: 1 Visit                     D. Scott Joeph Szatkowski PT, DPT 11/24/23, 4:12 PM
